# Patient Record
Sex: Female | Born: 1965 | Race: White | Hispanic: No | Marital: Married | Smoking: Former smoker
Health system: Southern US, Community
[De-identification: ages and names within clinical notes are randomized; demographics above are authoritative.]

## PROBLEM LIST (undated history)

## (undated) DIAGNOSIS — E039 Hypothyroidism, unspecified: Secondary | ICD-10-CM

## (undated) DIAGNOSIS — R531 Weakness: Principal | ICD-10-CM

## (undated) DIAGNOSIS — K56609 Unspecified intestinal obstruction, unspecified as to partial versus complete obstruction: Secondary | ICD-10-CM

## (undated) DIAGNOSIS — Z780 Asymptomatic menopausal state: Secondary | ICD-10-CM

## (undated) DIAGNOSIS — N809 Endometriosis, unspecified: Secondary | ICD-10-CM

## (undated) DIAGNOSIS — M479 Spondylosis, unspecified: Secondary | ICD-10-CM

## (undated) HISTORY — DX: Weakness: R53.1

## (undated) HISTORY — PX: BACK SURGERY: SHX140

## (undated) HISTORY — PX: OTHER SURGICAL HISTORY: SHX169

## (undated) HISTORY — PX: TUBAL LIGATION: SHX77

## (undated) HISTORY — PX: DILATION AND CURETTAGE OF UTERUS: SHX78

## (undated) HISTORY — DX: Asymptomatic menopausal state: Z78.0

## (undated) HISTORY — DX: Spondylosis, unspecified: M47.9

---

## 2000-11-01 ENCOUNTER — Observation Stay (HOSPITAL_COMMUNITY): Admission: RE | Admit: 2000-11-01 | Discharge: 2000-11-02 | Payer: Self-pay | Admitting: Orthopedic Surgery

## 2000-11-01 ENCOUNTER — Encounter: Payer: Self-pay | Admitting: Orthopedic Surgery

## 2001-04-21 ENCOUNTER — Ambulatory Visit (HOSPITAL_COMMUNITY): Admission: RE | Admit: 2001-04-21 | Discharge: 2001-04-21 | Payer: Self-pay | Admitting: Orthopedic Surgery

## 2001-05-19 ENCOUNTER — Other Ambulatory Visit: Admission: RE | Admit: 2001-05-19 | Discharge: 2001-05-19 | Payer: Self-pay | Admitting: Obstetrics and Gynecology

## 2002-02-15 ENCOUNTER — Encounter: Payer: Self-pay | Admitting: Obstetrics and Gynecology

## 2002-02-15 ENCOUNTER — Ambulatory Visit (HOSPITAL_COMMUNITY): Admission: RE | Admit: 2002-02-15 | Discharge: 2002-02-15 | Payer: Self-pay | Admitting: Obstetrics and Gynecology

## 2002-12-24 ENCOUNTER — Other Ambulatory Visit: Admission: RE | Admit: 2002-12-24 | Discharge: 2002-12-24 | Payer: Self-pay | Admitting: Obstetrics and Gynecology

## 2003-01-30 ENCOUNTER — Other Ambulatory Visit: Admission: RE | Admit: 2003-01-30 | Discharge: 2003-01-30 | Payer: Self-pay | Admitting: Obstetrics and Gynecology

## 2003-04-05 ENCOUNTER — Encounter (INDEPENDENT_AMBULATORY_CARE_PROVIDER_SITE_OTHER): Payer: Self-pay | Admitting: *Deleted

## 2003-04-05 ENCOUNTER — Ambulatory Visit (HOSPITAL_COMMUNITY): Admission: RE | Admit: 2003-04-05 | Discharge: 2003-04-05 | Payer: Self-pay | Admitting: Obstetrics and Gynecology

## 2003-05-30 ENCOUNTER — Encounter: Admission: RE | Admit: 2003-05-30 | Discharge: 2003-05-30 | Payer: Self-pay | Admitting: Specialist

## 2003-08-09 ENCOUNTER — Other Ambulatory Visit: Admission: RE | Admit: 2003-08-09 | Discharge: 2003-08-09 | Payer: Self-pay | Admitting: Obstetrics and Gynecology

## 2003-08-28 ENCOUNTER — Encounter: Admission: RE | Admit: 2003-08-28 | Discharge: 2003-08-28 | Payer: Self-pay | Admitting: Specialist

## 2003-08-30 ENCOUNTER — Observation Stay (HOSPITAL_COMMUNITY): Admission: RE | Admit: 2003-08-30 | Discharge: 2003-08-31 | Payer: Self-pay | Admitting: Specialist

## 2004-01-31 ENCOUNTER — Other Ambulatory Visit: Admission: RE | Admit: 2004-01-31 | Discharge: 2004-01-31 | Payer: Self-pay | Admitting: Obstetrics and Gynecology

## 2004-07-24 ENCOUNTER — Other Ambulatory Visit: Admission: RE | Admit: 2004-07-24 | Discharge: 2004-07-24 | Payer: Self-pay | Admitting: Obstetrics and Gynecology

## 2005-03-19 ENCOUNTER — Other Ambulatory Visit: Admission: RE | Admit: 2005-03-19 | Discharge: 2005-03-19 | Payer: Self-pay | Admitting: Obstetrics and Gynecology

## 2007-01-03 ENCOUNTER — Emergency Department (HOSPITAL_COMMUNITY): Admission: EM | Admit: 2007-01-03 | Discharge: 2007-01-03 | Payer: Self-pay | Admitting: Emergency Medicine

## 2007-09-28 ENCOUNTER — Ambulatory Visit (HOSPITAL_COMMUNITY): Admission: RE | Admit: 2007-09-28 | Discharge: 2007-09-28 | Payer: Self-pay | Admitting: Obstetrics and Gynecology

## 2009-09-02 ENCOUNTER — Encounter: Admission: RE | Admit: 2009-09-02 | Discharge: 2009-09-02 | Payer: Self-pay | Admitting: Allergy and Immunology

## 2010-05-21 ENCOUNTER — Emergency Department (HOSPITAL_COMMUNITY)
Admission: EM | Admit: 2010-05-21 | Discharge: 2010-05-21 | Payer: Self-pay | Source: Home / Self Care | Admitting: Emergency Medicine

## 2010-05-21 ENCOUNTER — Encounter
Admission: RE | Admit: 2010-05-21 | Discharge: 2010-05-21 | Payer: Self-pay | Source: Home / Self Care | Attending: Gastroenterology | Admitting: Gastroenterology

## 2010-05-28 ENCOUNTER — Encounter
Admission: RE | Admit: 2010-05-28 | Discharge: 2010-05-28 | Payer: Self-pay | Source: Home / Self Care | Attending: Gastroenterology | Admitting: Gastroenterology

## 2010-06-14 ENCOUNTER — Encounter: Payer: Self-pay | Admitting: Specialist

## 2010-08-03 LAB — DIFFERENTIAL
Basophils Absolute: 0 10*3/uL (ref 0.0–0.1)
Basophils Relative: 0 % (ref 0–1)
Eosinophils Absolute: 0 10*3/uL (ref 0.0–0.7)
Eosinophils Relative: 0 % (ref 0–5)
Lymphs Abs: 0.9 10*3/uL (ref 0.7–4.0)
Monocytes Relative: 8 % (ref 3–12)
Neutro Abs: 4.8 10*3/uL (ref 1.7–7.7)

## 2010-08-03 LAB — URINE MICROSCOPIC-ADD ON

## 2010-08-03 LAB — CBC
Hemoglobin: 13.2 g/dL (ref 12.0–15.0)
MCV: 92.2 fL (ref 78.0–100.0)
RBC: 4.36 MIL/uL (ref 3.87–5.11)
RDW: 12.5 % (ref 11.5–15.5)
WBC: 6.3 10*3/uL (ref 4.0–10.5)

## 2010-08-03 LAB — URINALYSIS, ROUTINE W REFLEX MICROSCOPIC
Bilirubin Urine: NEGATIVE
Glucose, UA: NEGATIVE mg/dL
Specific Gravity, Urine: 1.018 (ref 1.005–1.030)
pH: 6 (ref 5.0–8.0)

## 2010-08-03 LAB — POCT I-STAT, CHEM 8
Calcium, Ion: 1.12 mmol/L (ref 1.12–1.32)
Sodium: 139 mEq/L (ref 135–145)
TCO2: 26 mmol/L (ref 0–100)

## 2010-10-06 NOTE — Op Note (Signed)
[name], [name]                  ACCOUNT NO.:  [id number]   MEDICAL RECORD NO.:  [id number]          PATIENT TYPE:  AMB   LOCATION:  SDC                           FACILITY:  [facility]   PHYSICIAN:  Guy Sandifer. Henderson Cloud, M.D. DATE OF BIRTH:  Nov 26, 1965   DATE OF PROCEDURE:  09/28/2007  DATE OF DISCHARGE:                               OPERATIVE REPORT   PREOPERATIVE DIAGNOSIS:  Pelvic pain and desires permanent  sterilization.   POSTOPERATIVE DIAGNOSIS:  Pelvic pain and desires permanent  sterilization.   PROCEDURE:  Laparoscopy with bilateral tubal ligation with Filshie clips  and ablation of endometriosis.   SURGEON:  Guy Sandifer. Henderson Cloud, M.D.   ANESTHESIA:  General endotracheal intubation.   ESTIMATED BLOOD LOSS:  Drops.   INDICATIONS AND CONSENT:  This patient is a 45 year old married white  female G1, P1 with known endometriosis and steadily increasing  premenstrual pelvic pain.  She desires permanent sterilization.  After  discussion of options she is admitted for laparoscopy with tubal  ligation.  Alternate methods of contraception, permanence of the  procedure, failure rate, increased ectopic risk of tubal ligation are  reviewed.  Potential risks and complications are also discussed  preoperatively including but limited to infection, organ damage,  bleeding requiring transfusion, blood products with HIV, and hepatitis  acquisition, DVT, PE, and pneumonia.  All questions are answered and  consent is signed on the chart.   FINDINGS:  Upper abdomen is grossly normal.  Appendix is normal.  Uterus  is normal in size and contour.  Anterior and posterior cul-de-sacs are  normal.  There is a 1 cm black spot of endometriosis on the left pelvic  sidewall well above the course of the ureter.  There is a 5-mm white  puckered area of endometriosis on the right pelvic sidewall well above  the course of the ureter.  Tubes and ovaries are normal bilaterally.   PROCEDURE:  The patient is taken  to operating room where she is  identified, placed in dorsal supine position, and general anesthesia is  induced.  She is then placed in dorsal lithotomy position where she is  prepped abdominally and vaginally, bladder straight catheterized.  Hulka  tenaculum is placed in uterus as a manipulator.  She is draped in a  sterile fashion.  The infraumbilical and suprapubic areas are injected  in the midline with 0.5% plain Marcaine.  Small infraumbilical incision  is made and a disposable Veress needle is placed without difficulty.  A  normal syringe and drop test are noted.  2 L of gas are insufflated  under low pressure with good tympany in the right upper quadrant.  Veress needle is removed and a 10/11 XL bladeless disposable trocar  sleeve is placed using direct visualization with the diagnostic  laparoscope.  After placement, the operative laparoscope is placed.  Small suprapubic incision is made and a 5-mm XL bladeless disposable  trocar sleeve is placed under direct visualization without difficulty.  The above findings are noted.  The endometriosis on the pelvic sidewalls  are ablated with bipolar cautery bilaterally.  This is well above the  course of the ureters.  Then the right fallopian tube is grasped and  elevated and a Filshie clip is placed on the proximal 01/30 of the tube.  Similar procedure is carried out on the left side.  After removing the  Filshie clip applicator, inspection reveals entire width of the tube to  be within the clip.  The heel of the clip can be seen to the mesosalpinx  bilaterally.  Good hemostasis is noted.  10 mL of 0.5% plain Marcaine is  dripped on the fallopian tubes bilaterally as well.  The suprapubic  trocar sleeve is removed.  The  pneumoperitoneum is reduced and the umbilical trocar sleeve is removed.  The umbilical incision is closed with a subcuticular 2-0 Vicryl suture.  Both incisions are closed with Dermabond.  Hulka tenaculum is removed   and no bleeding is noted.  All counts are correct and the patient is  wakened and taken to recovery room in stable condition.      Guy Sandifer Henderson Cloud, M.D.  Electronically Signed     JET/MEDQ  D:  09/28/2007  T:  09/28/2007  Job:  [id number]

## 2010-10-06 NOTE — H&P (Signed)
NAMEMARLEAH, [name]                  ACCOUNT NO.:  [id number]   MEDICAL RECORD NO.:  [id number]          PATIENT TYPE:  AMB   LOCATION:  SDC                           FACILITY:  [facility]   PHYSICIAN:  Guy Sandifer. Henderson Cloud, M.D. DATE OF BIRTH:  09/01/1965   DATE OF ADMISSION:  DATE OF DISCHARGE:                              HISTORY & PHYSICAL   CHIEF COMPLAINT:  Pelvic pain and desires permanent sterilization.   HISTORY OF PRESENT ILLNESS:  The patient is a 45 year old married white  female G1, P1 who has steadily increasing premenstrual pain starting  several days before each menses.  It is becoming progressively worse.  Endometriosis was documented on laparoscopy in 2004.  After discussion  of options, she is being admitted for laparoscopy with tubal ligation.  The patient requests permanent sterilization.  Alternate methods of  contraception, failure rate, risks and benefits of surgery have been  reviewed preoperatively.   PAST MEDICAL HISTORY:  1. Endometriosis.  2. History of cervical dysplasia.   PAST SURGICAL HISTORY:  1. Laparoscopy as above.  2. Wrist surgery.  3. LEEP procedure 1996.   MEDICATIONS:  Mobic daily.   No known drug allergies.   SOCIAL HISTORY:  Denies tobacco abuse.  Drinks alcohol on social basis.  Denies drug abuse.   FAMILY HISTORY:  Noncontributory.   REVIEW OF SYSTEMS:  NEUROLOGICAL:  Denies headache.  CARDIAC:  Denies  chest pain.  PULMONARY:  Denies shortness of breath.   PHYSICAL EXAMINATION:  Height 5 feet 0 inches, weight 128 pounds, blood  pressure 100/64.  HEENT:  Without thyromegaly.  LUNGS:  Clear to auscultation.  HEART:  Regular rate and rhythm.  BACK:  Without CVA tenderness.  BREASTS:  Without mass, retraction, or discharge.  ABDOMEN:  Soft, nontender without masses.  PELVIC EXAM:  Vulva, vagina, cervix without lesions.  Uterus is normal  size, mobile, nontender.  Adnexa nontender without masses.  EXTREMITIES:  Grossly within normal  limits.  NEUROLOGICAL EXAM:  Grossly within normal limits.   ASSESSMENT:  Pelvic pain and desires permanent sterilization.   PLAN:  Laparoscopy and tubal ligation with Filshie clips.      Guy Sandifer Henderson Cloud, M.D.  Electronically Signed     JET/MEDQ  D:  09/27/2007  T:  09/27/2007  Job:  [id number]

## 2010-10-09 NOTE — Op Note (Signed)
NAME:  JAYCEY, GENS                            ACCOUNT NO.:  [id number]   MEDICAL RECORD NO.:  [id number]                   PATIENT TYPE:  AMB   LOCATION:  SDC                                  FACILITY:  [facility]   PHYSICIAN:  Guy Sandifer. Arleta Creek, M.D.           DATE OF BIRTH:  1965-09-28   DATE OF PROCEDURE:  04/05/2003  DATE OF DISCHARGE:                                 OPERATIVE REPORT   PREOPERATIVE DIAGNOSIS:  Pelvic pain.   POSTOPERATIVE DIAGNOSIS:  Endometriosis.   PROCEDURE:  Laparoscopy with resection and ablation of endometriosis and  chromopertubation of fallopian tubes.   SURGEON:  Guy Sandifer. Henderson Cloud, M.D.   ANESTHESIA:  General with endotracheal intubation.   ESTIMATED BLOOD LOSS:  Drops.   SPECIMENS:  Biopsy of right and left pelvis.   INDICATIONS AND CONSENT:  This patient is a 45 year old married white  female, G1, P1, with increasing pelvic pain.  Laparoscopy has been discussed  with the patient preoperatively.  Potential risks and complications have  been discussed preoperatively, including but not limited to infection,  bowel, bladder, and ureteral damage, bleeding requiring transfusion of blood  products with possible transfusion reaction, HIV and hepatitis acquisition,  DVT, PE, pneumonia, and recurrent pain.  All questions have been answered  and consent is signed on the chart.   FINDINGS:  Her upper abdomen is grossly normal.  The appendix is normal.  The uterus is normal in size and contour.  The fallopian tubes are normal  bilaterally with dye noted from each tube upon chromopertubation.  Ovaries  are normal.  Anterior cul-de-sac is normal.  The posterior cul-de-sac  contains a single black implant of endometriosis in the mid-cul-de-sac.  The  right pelvis contains a light gray implant of endometriosis about 4 mm in  diameter lateral to the ureter.  The left pelvis contains a dark-black 8 mm  heavy implant of endometriosis lateral to the ureter.   DESCRIPTION OF PROCEDURE:  The patient is taken to the operating room and  placed in the dorsal supine position, where general anesthesia is induced  via endotracheal intubation.  She is then placed in the dorsal lithotomy  position, where she is prepped abdominally and vaginally, the bladder is  straight-catheterized, an acorn tenaculum is placed in the cervix and held  with a single-tooth tenaculum, and she is draped in a sterile fashion.  A  small infraumbilical incision is made and a Veress needle is placed and the  syringe and drop test are normal.  Two liters of gas are insufflated under  low pressure with good percussion in the right upper quadrant.  The Veress  needle is removed and is replaced with a 10-11 disposable trocar sleeve.  Placement is verified with the laparoscope and no damage to surrounding  structures is noted.  A small suprapubic incision is made and a 5 mm  nondisposable trocar sleeve is  placed under direct visualization without  difficulty.  The above findings are noted.  The implants of endometriosis  are ablated in the posterior cul-de-sac as well as an additional implant on  the distal end of the right uterosacral ligament.  The implants on the right  and left pelvis are resected sharply.  This is done well clear of the  ureter.  Hemostasis is obtained with bipolar cautery at the peritoneal  edges.  The ureters are seen to peristalse normally before and after the  procedure.  Good hemostasis is verified.  Excess fluid is removed.  Interceed is back-loaded through the laparoscope and placed over both of the  areas of biopsy.  The suprapubic trocar sleeve is removed and the peritoneum  is reduced and good hemostasis is noted all around.  The umbilical trocar  sleeve is removed and the umbilical incision is closed with a 0 Vicryl in  the subcutaneous layers.  Both incisions are then injected with 0.5% plain  Marcaine and skin is closed with Dermabond on both  incisions.  Instruments  are removed from the vagina.  Good hemostasis is noted.  All counts are  correct.  The patient is awakened and taken to the recovery room in stable  condition.                                               Guy Sandifer Arleta Creek, M.D.    JET/MEDQ  D:  04/05/2003  T:  04/05/2003  Job:  [id number]

## 2010-10-09 NOTE — Op Note (Signed)
[facility]  Patient:    [name], [name] Visit Number: [id number] MRN: [id number]          Service Type: Attending:  Elisha Ponder, M.D. Dictated by:   Elisha Ponder, M.D.                             Operative Report  DATE OF BIRTH:  1965-05-26.  PREOPERATIVE DIAGNOSIS:  Status post right wrist reconstruction with corrective osteotomy, triangular fibrocartilage repair, etc.  Patient presents for hardware removal, manipulation under anesthesia, and tenolysis of the extensor tendons as necessary.  POSTOPERATIVE DIAGNOSIS:  Status post right wrist reconstruction with corrective osteotomy, triangular fibrocartilage repair, etc.  Patient presents for hardware removal, manipulation under anesthesia, and tenolysis of the extensor tendons as necessary.  PROCEDURES: 1. Manipulation under anesthesia, right wrist. 2. Extensor pollicis longus tenolysis, right wrist. 3. Removal of retained hardware from the right wrist.  SURGEON: Elisha Ponder, M.D.  COMPLICATIONS:  None.  ANESTHESIA:  General.  DRAINS:  One.  TOURNIQUET TIME:  Less than an hour.  INDICATION FOR PROCEDURE:  This patient is a very pleasant 45 year old female who presents with the above-mentioned diagnosis.  I have counseled her in regard to risks and benefits of surgery, including risk of infection, bleeding, anesthesia, damage to normal structures, and failure of surgery to accomplish the intended goals of relieving symptoms and restoring function. This patient has had a very significant fracture of her wrist with TFC injury. She underwent corrective osteotomy with open TFC repair.  Following this, she underwent supervised therapy and now presents for hardware removal, manipulation under anesthesia, and tenolysis as necessary.  She understands all risks and benefits.  DESCRIPTION OF PROCEDURE:  The patient was seen by myself and anesthesia. Following this, she was taken to the  operative suite and underwent prophylactic antibiotic administration, was laid supine and appropriately padded and underwent smooth induction of general anesthesia under the direction of Bedelia Person, M.D.  Once this was done, the patient then had the arm isolated, prepped and draped in the usual sterile fashion about the right upper extremity.  Following this, the patient underwent a gentle manipulation under anesthesia after sterile draping was applied.  The manipulation under anesthesia was in the flexion, extension, and pronation-supination planes.  My main objective was to increase her supination as well as flexion and extension.  Supination was achieved to full passively, and there was no significant rebound effect from this.  I was able to manipulate her to a point where she was stiffened and then pressed her past this point somewhat.  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  She tolerated this without difficulty.  There was no iatrogenic fracture noted.  The patient tolerated the manipulation under anesthesia well.  Following this the patient had an incision made under 250 mm of tourniquet control about the dorsal aspect of the wrist.  An ellipse of skin was taken from her previous scar.  Following this, the ellipse of skin was removed and dissection was carried down.  The EPL tendon was found and noted to be somewhat encased in some scar tissue.  Thus, she underwent a tenolysis throughout its length.  I performed a tenolysis as necessary about the third compartment (EPL).  This was done without difficulty under 4.0 loupe magnification, taking care to avoid superficial radial nerve and its branches. Following this, the patient then underwent isolation of the plate.  This was  isolated and then screws were removed, as was the condylar blade plate.  This blade plate was removed to my satisfaction without difficulty.  Following this, the wrist was taken through a range of motion and noted to be  excellent. The osteotomy site was well-healed, and there were no complications.  At this juncture I then removed the tourniquet which was inflated and applied a copious amount of irrigation to the wound.  Hemostasis was secured with bipolar electrocautery, and the wound was then closed with 3-0 Vicryl followed by subcuticular Prolene stitch.  Steri-Strips were then placed.  A TLS drain #7 in size was placed prior to definitive wound closure.  This was allowed to exit proximally.  Marcaine 0.25% without epinephrine was placed in the wound for postop analgesia.  The patient tolerated this well, a short-arm splint was applied, and the patient was awake, alert, and oriented in the recovery room, and I did show her the improved range of motion.  She tolerated this well without difficulty, and there were no complications.  All sponge, needle, and instrument counts were reported as correct.  I have asked the patient to notify me should any problems occur.  She will remove her drain tomorrow.  She was discharged home on Percocet and Robaxin.  All questions have been encouraged and answered. Dictated by:   Elisha Ponder, M.D. Attending:  Elisha Ponder, M.D. DD:  04/21/01 TD:  04/21/01 Job: [id number]

## 2010-10-09 NOTE — Op Note (Signed)
[facility]  Patient:    [name], [name]                         MRN: [id number] Proc. Date: 11/03/00 Adm. Date:  [id number] Disc. 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  OPERATION: 1. Corrective osteotomy, right radius, with iliac crest bone graft obtained    from the right hip and plating with a Synthes fixed blade plate. 2. Posterior interosseous nerve neurectomy, right wrist. 3. Open right triangular fibular cartilage ligament tear repair which was a    chronic tear. 4. Pinning, right distal radial ulnar joint.  SURGEON:  Elisha Ponder, M.D.  ASSISTANTJill Side P. Mahar, P.A.  ANESTHESIA:  General endotracheal anesthesia.  TOURNIQUET TIME:  30 minutes followed by deflation of greater than 20 minutes followed by reinflation to approximately 100 minutes.  DRAINS:  Two drains were placed in forearm and wrist, and one drain was placed in the hip.  COMPLICATIONS:  None.  INDICATIONS FOR PROCEDURE:  This patient is a 45 year old white female who sustained a radius fracture previously.  This went on to some progressive angulatory collapse and subsequent distal radial ulnar joint dislocation secondary to a destabilizing TFC tear with ulnar styloid fracture.  The patient had a type II TFC tear.  This went on to instability, distal ulnar joint dislocation, and radial shortening despite aggressive surgical efforts. The patient now has chronic pain, loss of motion and  grip strength.  I have counselled her in regards to risks and benefits of surgery and have counselled her in regards to surgical options and conservative options.  Alternatives, etc., were discussed at length; and, with this in mind, she has decided to proceed with the above-mentioned operation.  I discussed with her that this is not 100% guaranteed for restoration of function, but we will try to improve the anatomy to its normal state.  I have obtained preoperative MRI, radiographs, and other objective information from her exam to aid [place] in her workup.  All questions have been encouraged and answered.  She desires to proceed.  OPERATIVE FINDINGS:  The patient had a malunion of her radius.  This was corrected.  Volar tilt was restored and high three gained.  The patient had a large destabilizing TFC tear which was repaired.  The distal radial ulnar joint was pinned in slight supination to allow for TFC healing.  The patient had recreation of the anatomy performed without difficulty as described within the context of this op note.  PROCEDURE IN DETAIL:  The patient was seen by myself in anesthesia.  She was given preoperative antibiotics, taken to the operative suite, and underwent smooth induction of general endotracheal anesthesia.  Following this, she was placed supine, appropriately padded, and prepped and draped in the usual sterile fashion of both her right upper extremity and right hip region.  After sterile field was secured, the patient  had the arm elevated and tourniquet insufflated to 250 mmHg.  Under tourniquet control, the patient had incision made dorsal radially.  Dissection was carried through skin with knife blade. Following this, the patient had the EPL identified and transposed.  The EDC was swept ulnarly.  The posterior interosseous nerve identified, and this underwent neurectomy with a crushing and cauterization technique.  Once this was done, the distal radius was  exposed subperiosteally.  Metacarpal retractors were placed, and the the patient then underwent corrective osteotomy by placing 0.062 K wires in the distal radius in line with the articular surface.  Following this, osteotomy cut was made proximal to the distal radial ulnar joint as noted on the radiographic image.  Care was taken to not cut the volar cortex.  The patient then had the two K wires previously placed directed distally in terms of distractionary technique.  This allowed for an opening wedge corrective osteotomy.  I then gauged the amount of bone graft that would be needed and deflated the tourniquet.  X-rays were taken at this time which did show correction of the volar tilt and restoration of radial height.  With this noted, wet antibiotic-soaked sponges were placed over the wound, and the patient then had attention turned towards the hip where iliac crest bone graft was obtained. This was obtained without difficulty by incising the skin, dissecting down to the fascia sharply, and then elevating the periosteal tissue off of the iliac crest and taking a bone graft with cancellous as well as tricortical bone. This was done to my satisfaction without difficulty.  The patient did not have any destabilization of her iliac crest occur during this period.  Once this bone graft was taken, additional cancellous bone was removed, then a copious irrigation, placement of Gelfoam, hemostasis ensured, and placement of an 8-inch Hemovac drain with closure of the wound in layered fashion with 0 Vicryl followed by Vicryl in the subcutaneous tissue and closure of the skin edge with Prolene.  Once this was done, the drain was hooked up to suction.  Attention was then turned back towards the radius where the bone graft was inserted.  Distractionary technique used and correction obtained with regards to radial inclination, volar tilt, and radial height.  Bone graft was packed nicely, and  fixation was then achieved with 2.7 fixed blade plate.  Screws were placed proximally and distally to secure the graft.  Preliminary fixation  was then removed, and wrist was taken through range of motion and noted to be stable with the corrections obtained.  Following this, the patient had the extensor retinaculum closed over the plate.  This was done with a Mersilene and Vicryl combination suture, and periosteal tissues were closed over the plate so that the EPL was left in the subcutaneous region, transposed, without any contacts with the plate.  The ECRB and ECRL tendons were stable and without plate contact, and the EDC was without contact against the plate as well.   I was very happy with the closure and corrections obtained.  Once corrective osteotomy was performed, an incision was made about the ulnar border of the hand.  Dissection was carried down to the interval between the ECU and EDM.  This was opened and, following this, the TFC was identified with an inverted T capsular cut.  The patients destabilized TFC tear with associated ulnar styloid fracture was identified and mobilized.  Once this was done, the patient then had the distal ulna prepared.  This was done  with rongeur to greatest stable bleeding base of bone.  The patient had a destabilizing tear of the TFC with a ligamentum subcruatum attached to the TFC.  This area was prepared with a curet for a nice bleeding base.  Following this, drill holes were placed for passage of sutures.  Once this was done and the area was prepared, the TFC had multiple strands of Merselene suture placed in a dorsal to palmar fashion.  Multiple Mersilene throws were placed to harness the TFC at its attachement to the distal ulnar fovia.  Once this was done, suture pass was placed through previously made drill holes with 0.045 Kirschner wire about the distal ulna.  These sutures were passed above and below the ECU in its position.  They exited  proximally about the ulnar shaft. This allowed the TFC to be replaced to its anatomic position which was the fovea of the ulna.  The tension was pulled and noted to be excellent.  I was pleased with this and with the overall integrity of the repair at this juncture.  I took care not to destabilize the ECU tendon itself.  Once this was done, the patient had pinning of the distal radial ulnar joint.  This was done in slight supination to maintain a corrected distal radial ulnar joint. The distal radial ulnar joint had previously been noted to be easily dislocatable and fat in a native dislocated state prior to the corrections. Thus, there was a large destabilizing tear with distal radial ulnar joint dislocation.  This was rather unstable.  I had concerns in regards to this preoperatively and intraoperatively.  However, intraoperative findings did document the obvious pathology, and hopefully this will allow restoration of the competency of the joint with TFC repair.  Once the joint was pinned in a reduced state, the TFC repair was then performed by tightening down the sutures close to the TFC and passed through the previously placed drill holes in the ulna.  The TFC sat nicely in its ulna fovia.  The sutures were tightened down appropriately without difficulty.  Following this, the ECU had stabilization performed by placing the extensor retinaculum below and then above it as a sling.  This extensor retinaculum was previously dissected on the approach.  It was taken off of the retinacular attachments around the FCU and based against the EDM region dorsally.  The patient had this secured without difficulty with Mersilene and Vicryl sutures.  The EDM glided nicely without problems, and there was no capturing of the tendons within the sutures.  Once this was accomplished, the patient had tourniquet deflated and hemostasis obtained with bipolar electrocautery.  Hemostasis was also  obtained during the initial dissection with cautery.  The patient had hemostasis achieved.  Two small TLS drains were placed.  Following this, the patient had pin caps placed over the 0.062 K wire, mobilizing the forearm, and the patient then had layered closure with Prolene placed in the skin edge.  Drains were hooked up to suction.  Sterile compressive dressings were applied.  I checked the compartments at the completion of the case.  They were noted to soft, nontender, and without abnormality.  The patient had good refill and hemostasis.  With this noted, the patient then had a long arm splint applied without difficulty by myself.  Dressing about the right hip was applied in sterile compressive variety.  She was awakened from anesthesia and transferred to the recovery room in stable condition.  I should note that I  did place Marcaine in the wounds for postoperative analgesia.  This was 0.25% without epinephrine in the arm and Marcaine/Sensorcaine with epinephrine in the hip. The patient was stable, awake, alert, and oriented in the recovery room, had good finger range of motion and sensation overall.  There were no complications.  She will be monitored at [facility].  I discussed all issues with her and her family.  At the present time, she will require a thorough rehabilitative effort.  We will need to ensure that she has no immediate postop complications, and then we will need to progress her along in therapy to protect both the corrective osteotomy and the distal radial ulnar joint stabilization procedure.  I have discussed all issues with her at length.  It was a pleasure to participate in her care, and I look forward to participating in her postoperative recovery. DD:  11/03/00 TD:  11/03/00 Job: [id number]

## 2010-10-09 NOTE — H&P (Signed)
   NAME:  [name], [name]                            ACCOUNT NO.:  [id number]   MEDICAL RECORD NO.:  [id number]                   PATIENT TYPE:  AMB   LOCATION:  SDC                                  FACILITY:  [facility]   PHYSICIAN:  Guy Sandifer. Arleta Creek, M.D.           DATE OF BIRTH:  1965/09/17   DATE OF ADMISSION:  04/05/2003  DATE OF DISCHARGE:                                HISTORY & PHYSICAL   CHIEF COMPLAINT:  Pelvic pain.   HISTORY OF PRESENT ILLNESS:  This patient is a 45 year old, married, white  female, G1, P1, who complains of increasing pelvic pain.  Laparoscopy has  been discussed with the patient.  Potential risks have been reviewed  preoperatively.   PAST MEDICAL HISTORY:  Cervical dysplasia.   PAST SURGICAL HISTORY:  1. Wrist surgery.  2. LEEP procedure in 1996.   MEDICATIONS:  Vitamins.   ALLERGIES:  No known drug allergies.   SOCIAL HISTORY:  The patient denies tobacco, alcohol, or drug abuse.   FAMILY HISTORY:  Noncontributory.   REVIEW OF SYSTEMS:  NEUROLOGIC:  Denies headache.  CARDIAC:  Denies chest  pain.  PULMONARY:  Denies shortness of breath.   PHYSICAL EXAMINATION:  VITAL SIGNS:  Height 5 feet 0 inches, weight 116.5  pounds, blood pressure 80/60.  HEENT:  Without thyromegaly.  LUNGS:  Clear to auscultation.  HEART:  Regular rate and rhythm.  BACK:  Without CVA tenderness.  BREASTS:  Without masses, retraction, or discharge.  ABDOMEN:  Soft, nontender without masses.  PELVIC:  Both the vagina and cervix without lesion.  Uterus normal size,  mobile, nontender.  Adnexa nontender without masses.  EXTREMITIES/NEUROLOGIC:  Grossly within normal limits.    ASSESSMENT:  Pelvic pain.   PLAN:  Laparoscopy.                                               Guy Sandifer Arleta Creek, M.D.    JET/MEDQ  D:  04/04/2003  T:  04/04/2003  Job:  [id number]

## 2010-10-09 NOTE — Op Note (Signed)
NAME:  [name], [name]                            ACCOUNT NO.:  [id number]   MEDICAL RECORD NO.:  [id number]                   PATIENT TYPE:  AMB   LOCATION:  DAY                                  FACILITY:  [facility]   PHYSICIAN:  Jene Every, M.D.                 DATE OF BIRTH:  Nov 22, 1965   DATE OF PROCEDURE:  DATE OF DISCHARGE:                                 OPERATIVE REPORT   PREOPERATIVE DIAGNOSES:  Degenerative disk disease L5-S1, spinal stenosis.   POSTOPERATIVE DIAGNOSES:  1. Degenerative disk disease L5-S1, spinal stenosis.  2. Vascular leash around the S1 nerve root.   PROCEDURE:  Lateral recess decompress with foraminotomy of S1,  hemilaminotomy, microdiskectomy. Neurolysis of the S1 nerve root.   ANESTHESIA:  General.   ASSISTANT:  Roma Schanz, P.A.   BRIEF HISTORY:  A 45 year old with S1 refractory radicular pain secondary to  HNP and lateral recess stenosis at 5-1 confirmed with MRI, continued  positive neural tension signs and selected conservative treatment.  Patient  with persistent pain, disabling.  Operative intervention was indicated for  decompression of the S2 nerve root. The risks and benefits were discussed  including bleeding, infection, injury to neurovascular structures, no change  in symptoms, worsening of symptoms, need for repeat diskectomy or fusion in  the future, etc.   TECHNIQUE:  The patient in supine position and after induction of adequate  general anesthesia and 1 g of Kefzol, she was placed prone on Andrews frame,  all bony prominences well padded. The lumbar region was prepped and draped  in the usual sterile fashion. An 18 gauge spinal needle was utilized to  localize the 5-1 interspace, confirmed with x-ray.  An incision was made  from the spinous process of 5 to S1, subcutaneous tissue was dissected,  electrocautery was utilized to achieve hemostasis. Dorsolumbar fascia  identified and divided in line with skin incision. The  paraspinous muscle  elevated from the lamina of 5 and S1, McCullough retractors placed,  operating microscope was draped and brought onto the surgical field.  The  ligamentum flavum was detached from the cephalad edge of S1 with a small  microcurette and the caudad edge of 5-1 utilizing the 10 mm Kerrison to  perform a hemilaminotomy.  Neural patties placed beneath the ligamentum  flavum, ligamentum flavum removed from the interspace.  I performed a  hemilaminotomy of the caudad edge of S1 and performed a foraminotomy of S1  due to some lateral recess stenosis.  At identifying the S1 nerve root,  gently mobilized medially a focal HNP as well as an epidural venous plexus  and leash was noted.  This was neurolysed to mobilize the S1 nerve root.  Electrocautery was utilized to achieve hemostasis. I made an annulotomy in  the 5-S1 disk space after confirming it in the intralaminar space with an x-  ray and I removed a copious  portion of disk material from the disk space and  the subannular space and mobilized it with a nerve hook and upbiting  straight pituitary. After a thorough diskectomy, the S1 nerve root was found  to be unimpinged, excursion 1 cm medial to the pedicle without difficulty.  Hockey stick probe placed at the foramen of S1 and 5 and found to be widely  patent. There was no disk material noted within the axilla of the root  underneath the thecal sac or cephalad.  I then copiously irrigated the disk  space with antibiotic irrigation.  Inspection revealed no CSF leakage or  active bleeding.  The McCullough retractor was removed, paraspinous muscles  inspected with no evidence of active bleeding.  The dorsolumbar fascia was  reapproximated with #0 Vicryl interrupted figure-of-eight sutures. The  subcutaneous tissue reapproximated with 2-0 Vicryl simple sutures, skin was  reapproximated with 4-0 subcuticular Prolene. The wound was reinforced with  Steri-Strips, sterile dressing  applied. She was placed supine on the  hospital bed, extubated without difficulty and transported to the recovery  room in satisfactory condition.   The patient tolerated the procedure well with no complications.                                               Jene Every, M.D.    [name]  D:  08/30/2003  T:  08/30/2003  Job:  [id number]

## 2010-10-09 NOTE — Consult Note (Signed)
NAME:  [name], [name]                            ACCOUNT NO.:  [id number]   MEDICAL RECORD NO.:  [id number]                   PATIENT TYPE:  AMB   LOCATION:  SDC                                  FACILITY:  [facility]   PHYSICIAN:  Olga Millers, M.D.                DATE OF BIRTH:  July 12, 1965   DATE OF CONSULTATION:  04/05/2003  DATE OF DISCHARGE:                                   CONSULTATION   CARDIOLOGY CONSULTATION:   REASON FOR CONSULTATION:  [name] is a pleasant 45 year old female with  past medical history of mitral valve prolapse, gastroesophageal reflux  disease and now status post laparoscopy for evaluation of PVCs.  The patient  states that she was diagnosed with mitral valve prolapse many years ago.  She did have an echocardiogram approximately 3 years ago but she does not  recall where.  There are no records available.  She typically is very active  and denies any dyspnea on exertion, orthopnea, PND, pedal edema, chest pain,  palpitations, presyncope, or history syncope.  The patient underwent  laparoscopic surgery today.  Postop in the recovery room she was noted to  have occasional PVCs and a rare couplet on telemetry.  Because of these we  were asked to further evaluate.  Of note she is completely asymptomatic with  these including no presyncope, no chest pain, no palpitations, and no  shortness of breath.   MEDICATIONS AT HOME INCLUDE:  Prenatal vitamin and Allegra.   SOCIAL HISTORY:  She does not smoke.  She occasionally consumes alcohol.   FAMILY HISTORY:  Negative for coronary artery disease or sudden death.   PAST MEDICAL HISTORY:  There is no diabetes mellitus, hypertension, or  hyperlipidemia.  She does have a history of gastroesophageal reflux disease.  She has a history of mitral valve prolapse by report.  She has had prior  surgery on her hand secondary to a prior fracture.  She has one son.  There  is no other significant past medical history noted.   REVIEW OF SYSTEMS:  She denies any headaches or fevers or chills.  There is  no productive cough or hemoptysis.  There is no dysphagia, odynophagia,  melena, or hematochezia.  There is no dysuria or hematuria.  There is no  rash or seizure activity.  There is no orthopnea, PND, or pedal edema.  There is no history of syncope.  There is no claudication noted.  There is  no recent weight loss.  The remaining systems are negative.   PHYSICAL EXAMINATION TODAY:  VITAL SIGNS:  Her physical exam today shows a  blood pressure of 90/60 and her pulse is 75.  GENERAL:  She is well developed and well nourished in no acute distress.  Her sin is warm and dry.  She does not appear to be depressed and there is  no __________.  HEAD AND NECK:  Her HEENT is unremarkable with no abnormalities.  Her neck  is supple with normal upstroke bilaterally and there are no bruits noted.  There is no jugular venous distention and no thyromegaly noted.  RESPIRATORY:  Her chest clear to auscultation with normal expansion.  CARDIOVASCULAR:  Regular rate and rhythm.  S1 and S2 are normal.  There is a  2/6 systolic ejection murmur at the left sternal border.  I cannot  appreciate a mitral valve click and there is no S3 or S4.  ABDOMEN:  She is status post laparoscopy.  I cannot appreciate  hepatosplenomegaly.  Her bowel sounds are diminished.  There is no pulsatile  masses palpated.  She has 2+ femoral pulses bilaterally and no bruits.  EXTREMITIES:  Her extremities show no edema and I can palpate no cords.  She  has 2+ dorsalis pedis pulses bilaterally.  NEUROLOGICAL:  Grossly intact.   ELECTROCARDIOGRAM:  Her electrocardiogram shows a normal sinus rhythm with  occasional PVCs.  There is a first degree AV block.  There is right axis  deviation.  There are no ST changes and her QT is not prolonged.   LABORATORIES:  Laboratories drawn today include a sodium of 136 with  potassium of 4.4.  Her BUN and creatinine are 12  and 0.7.  Her magnesium is  1.7.  The patient did have a CBC on April 01, 2003 which showed a white  blood cell count of 4.9 with a hemoglobin of 12.7 and hematocrit of 36.6.  Her platelet count was 258.  Her serum hCG at that time was normal.   DIAGNOSES:  1. Asymptomatic premature ventricular contractions and rare couplet noted.  2. History of mitral valve prolapse.  3. Status post laparoscopy.  4. History of gastroesophageal reflux disease.   PLAN:  [name] is for evaluation of asymptomatic PVCs and a rare couplet.  She is completely asymptomatic with no chest pain, shortness of breath or  palpations, and there is no history of syncope.  Her electrolytes are normal  and there is no history of CHF symptoms or chest pain.  Her QT is not  prolonged.  Her PVCs are most likely secondary to the mild hyperadrenergic  state associated with her recent surgery.  Given that she is asymptomatic  and there is no history of syncope or heart failure symptoms we will plan to  discharge today from a cardiac standpoint.  We will plan an outpatient  echocardiogram to reassess her LV function and to reassess her mitral valve  prolapse.  She will follow up with me after the echocardiogram.  I do not  think therapy is required at this point as her PVCs are asymptomatic.                                               Olga Millers, M.D.    BC/MEDQ  D:  04/05/2003  T:  04/05/2003  Job:  [id number]

## 2010-10-12 ENCOUNTER — Other Ambulatory Visit: Payer: Self-pay | Admitting: Gastroenterology

## 2010-12-07 ENCOUNTER — Ambulatory Visit
Admission: RE | Admit: 2010-12-07 | Discharge: 2010-12-07 | Disposition: A | Discharge status: Home / Self Care | Payer: Federal, State, Local not specified - PPO | Source: Ambulatory Visit | Attending: Gastroenterology | Admitting: Gastroenterology

## 2010-12-07 ENCOUNTER — Other Ambulatory Visit: Payer: Self-pay | Admitting: Gastroenterology

## 2010-12-07 DIAGNOSIS — R52 Pain, unspecified: Secondary | ICD-10-CM

## 2011-01-16 ENCOUNTER — Inpatient Hospital Stay (HOSPITAL_COMMUNITY)
Admission: EM | Admit: 2011-01-16 | Discharge: 2011-01-19 | DRG: 181 | Disposition: A | Discharge status: Home / Self Care | Payer: Federal, State, Local not specified - PPO | Attending: Internal Medicine | Admitting: Internal Medicine

## 2011-01-16 ENCOUNTER — Emergency Department (HOSPITAL_COMMUNITY): Payer: Federal, State, Local not specified - PPO

## 2011-01-16 DIAGNOSIS — I059 Rheumatic mitral valve disease, unspecified: Secondary | ICD-10-CM | POA: Diagnosis present

## 2011-01-16 DIAGNOSIS — K219 Gastro-esophageal reflux disease without esophagitis: Secondary | ICD-10-CM | POA: Diagnosis present

## 2011-01-16 DIAGNOSIS — Z8742 Personal history of other diseases of the female genital tract: Secondary | ICD-10-CM

## 2011-01-16 DIAGNOSIS — K633 Ulcer of intestine: Secondary | ICD-10-CM | POA: Diagnosis present

## 2011-01-16 DIAGNOSIS — E039 Hypothyroidism, unspecified: Secondary | ICD-10-CM | POA: Diagnosis present

## 2011-01-16 DIAGNOSIS — K56609 Unspecified intestinal obstruction, unspecified as to partial versus complete obstruction: Principal | ICD-10-CM | POA: Diagnosis present

## 2011-01-16 DIAGNOSIS — IMO0002 Reserved for concepts with insufficient information to code with codable children: Secondary | ICD-10-CM | POA: Diagnosis present

## 2011-01-16 LAB — COMPREHENSIVE METABOLIC PANEL
ALT: 19 U/L (ref 0–35)
Albumin: 4.5 g/dL (ref 3.5–5.2)
BUN: 13 mg/dL (ref 6–23)
CO2: 24 mEq/L (ref 19–32)
Chloride: 95 mEq/L — ABNORMAL LOW (ref 96–112)
GFR calc Af Amer: >60 mL/min (ref >60)
GFR calc non Af Amer: >60 mL/min (ref >60)
Sodium: 132 mEq/L — ABNORMAL LOW (ref 135–145)
Total Bilirubin: 0.3 mg/dL (ref 0.3–1.2)
Total Protein: 9 g/dL — ABNORMAL HIGH (ref 6.0–8.3)

## 2011-01-16 LAB — DIFFERENTIAL
Basophils Absolute: 0 10*3/uL (ref 0.0–0.1)
Basophils Relative: 0 % (ref 0–1)
Eosinophils Absolute: 0 10*3/uL (ref 0.0–0.7)
Eosinophils Relative: 0 % (ref 0–5)
Lymphocytes Relative: 5 % — ABNORMAL LOW (ref 12–46)
Lymphs Abs: 0.6 10*3/uL — ABNORMAL LOW (ref 0.7–4.0)
Monocytes Absolute: 0.3 10*3/uL (ref 0.1–1.0)
Monocytes Relative: 3 % (ref 3–12)
Neutro Abs: 9.9 10*3/uL — ABNORMAL HIGH (ref 1.7–7.7)
Neutrophils Relative %: 91 % — ABNORMAL HIGH (ref 43–77)

## 2011-01-16 LAB — CBC
HCT: 44.9 % (ref 36.0–46.0)
Hemoglobin: 15.3 g/dL — ABNORMAL HIGH (ref 12.0–15.0)
MCH: 31.2 pg (ref 26.0–34.0)
MCHC: 34.1 g/dL (ref 30.0–36.0)
MCV: 91.4 fL (ref 78.0–100.0)
Platelets: 319 10*3/uL (ref 150–400)
RBC: 4.91 MIL/uL (ref 3.87–5.11)
RDW: 12.6 % (ref 11.5–15.5)
WBC: 10.8 10*3/uL — ABNORMAL HIGH (ref 4.0–10.5)

## 2011-01-16 LAB — URINALYSIS, ROUTINE W REFLEX MICROSCOPIC
Glucose, UA: NEGATIVE mg/dL
Ketones, ur: >80 mg/dL — AB
Nitrite: NEGATIVE
Specific Gravity, Urine: 1.029 (ref 1.005–1.030)
Urobilinogen, UA: 0.2 mg/dL (ref 0.0–1.0)

## 2011-01-16 LAB — URINE MICROSCOPIC-ADD ON

## 2011-01-16 LAB — LIPASE, BLOOD: Lipase: 40 U/L (ref 11–59)

## 2011-01-16 LAB — POCT PREGNANCY, URINE: Preg Test, Ur: NEGATIVE

## 2011-01-17 ENCOUNTER — Inpatient Hospital Stay (HOSPITAL_COMMUNITY): Payer: Federal, State, Local not specified - PPO

## 2011-01-17 LAB — VITAMIN B12: Vitamin B-12: 398 pg/mL (ref 211–911)

## 2011-01-17 LAB — CBC
MCHC: 34.1 g/dL (ref 30.0–36.0)
MCV: 92.1 fL (ref 78.0–100.0)
Platelets: 224 10*3/uL (ref 150–400)
RDW: 12.7 % (ref 11.5–15.5)
WBC: 6.4 10*3/uL (ref 4.0–10.5)

## 2011-01-17 LAB — LIPID PANEL
Cholesterol: 181 mg/dL (ref 0–200)
HDL: 65 mg/dL (ref >39)
Total CHOL/HDL Ratio: 2.8 RATIO
VLDL: 8 mg/dL (ref 0–40)

## 2011-01-17 LAB — BASIC METABOLIC PANEL
BUN: 8 mg/dL (ref 6–23)
Chloride: 105 mEq/L (ref 96–112)
Creatinine, Ser: 0.52 mg/dL (ref 0.50–1.10)
GFR calc Af Amer: >60 mL/min (ref >60)
GFR calc non Af Amer: >60 mL/min (ref >60)

## 2011-01-17 MED ORDER — IOHEXOL 300 MG/ML  SOLN
100.0000 mL | Freq: Once | INTRAMUSCULAR | Status: AC | PRN
  Administered 2011-01-17: 100 mL via INTRAVENOUS

## 2011-01-18 ENCOUNTER — Inpatient Hospital Stay (HOSPITAL_COMMUNITY): Payer: Federal, State, Local not specified - PPO

## 2011-01-18 LAB — CBC
HCT: 33.7 % — ABNORMAL LOW (ref 36.0–46.0)
Hemoglobin: 11 g/dL — ABNORMAL LOW (ref 12.0–15.0)
MCH: 30.9 pg (ref 26.0–34.0)
MCV: 94.7 fL (ref 78.0–100.0)
Platelets: 217 10*3/uL (ref 150–400)
RBC: 3.56 MIL/uL — ABNORMAL LOW (ref 3.87–5.11)

## 2011-01-18 LAB — BASIC METABOLIC PANEL
BUN: 5 mg/dL — ABNORMAL LOW (ref 6–23)
CO2: 28 mEq/L (ref 19–32)
Calcium: 8.3 mg/dL — ABNORMAL LOW (ref 8.4–10.5)
Chloride: 108 mEq/L (ref 96–112)
Creatinine, Ser: 0.58 mg/dL (ref 0.50–1.10)
Glucose, Bld: 90 mg/dL (ref 70–99)

## 2011-01-26 NOTE — Discharge Summary (Signed)
[name], [name] NO.:  [id number]  MEDICAL RECORD NO.:  [id number]  LOCATION:  1532                         FACILITY:  [facility]  PHYSICIAN:  Calvert Cantor, M.D.     DATE OF BIRTH:  16-Feb-1966  DATE OF ADMISSION:  01/16/2011 DATE OF DISCHARGE:  01/19/2011                              DISCHARGE SUMMARY   PRIMARY CARE PHYSICIAN:  Caryn Bee L. Little, M.D.  GASTROENTEROLOGIST:  Shirley Friar, MD.  PRESENTING COMPLAINT:  Abdominal pain, nausea, vomiting, and bloating  DISCHARGE DIAGNOSES: 1. Small bowel obstruction, resolved with conservative treatment. 2. Ulceration in the ileum. 3. Lymphadenopathy with calcified lymph nodes incidentally noted on CT     of the abdomen and pelvis.  PAST MEDICAL HISTORY: 1. Degenerative disk disease. 2. Hypothyroidism. 3. Gastroesophageal reflux disease. 4. Allergic rhinitis. 5. Mitral valve prolapse. 6. Endometriosis. 7. Cervical dysplasia.  DISCHARGE MEDICATIONS:  Dr. Ewing Schlein has told the patient that he will be calling in medications for nausea and abdominal spasms.  According to his last note, it was like that there will be some Phenergan and some sort of suppository, anyhow, I am not adding any new medications myself. The patient was on the following and the patient can be discharged home the following; 1. Bepreve 1.5% ophthalmic solution 1 to 2 drops in both eyes daily as     needed for allergies. 2. Hydrocodone/acetaminophen 5/325 one tablet status needed for pain. 3. Levothyroxine 25 mcg 1 tablet daily. 4. Methocarbamol 500 mg every 8 hours as needed, 5. Nasonex 1 spray daily in each nostril. 6. Nexium 40 mg daily. 7. Ranitidine 300 mg daily at bedtime. 8. Tylenol Extra Strength 500 mg 2 tablets every 6 hours as needed for     headache, pain, and fever.  CONSULTANTS:  Dr. Ewing Schlein with GI.  PROCEDURES:  On January 17, 2011, a CT of the abdomen and pelvis with contrast; dilated small-bowel loops with  decompressed distal small bowel loops.  Finding compatible with small destruction.  Of note, exact transition point is not visualized, but likely in the right lower quadrant.  Incidentally noted is a circum aortic left renal vein.  X-ray of abdomen on January 17, 2011, stable; malpositioned left tubal ligation clip posterior to the superior aspect of the liver.  Interval minimal atelectasis in the left lung base.  Antral borderline cardiomegaly.  Continued pattern of small bowel obstruction.  No nasogastric tube is present.  X-ray of the abdomen on January 18, 2011, resolving small bowel obstruction.  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  HOSPITAL COURSE:  This is a 45 year old female who came in with the above-mentioned abdominal symptoms.  She was found to have a small bowel obstruction and treated conservatively.  The patient did not require an NG tube as she was not vomiting after being admitted to the hospital. The patient was made n.p.o. and received IV fluids, and slowly the obstruction did resolve.  This morning she was able to tolerate a regular diet without any discomfort.  Dr. Ewing Schlein was consulted as the patient has had ileal ulcers in the recent past.  She was seen by Dr. Bosie Clos who did the initial colonoscopy  and biopsies.  At that time, biopsy results did not reveal any specific etiology.  Specifically, there were no active inflammation or granulomas present that would be consistent with an IVD.  The patient subsequently went to [facility] and had a capsule endoscopy performed, this was about a month ago.  She was told that according to that endoscopy, the ulcers were "in remission."  Due to this current episode of obstruction, she will need to follow up with GI once again. Dr. Ewing Schlein is recommending that she follow up with Dr. Bosie Clos in 1 to 2 weeks.  Of note, the patient was found to have some calcified lymph nodes and also look like a calcified granuloma in the  right lung base, this can be further evaluated by her primary care physician.  CONDITION ON DISCHARGE:  Stable.  PHYSICAL EXAMINATION:  ABDOMEN:  Soft, mild tenderness in right lower quadrant.  Bowel sounds are positive.  Abdomen is not distended. LUNGS:  Clear. HEART:  Regular rate and rhythm.  No murmurs.  FOLLOWUP INSTRUCTIONS: 1. Follow up with Dr. Bosie Clos in 1 to 2 weeks. 2. Follow with primary care physician Dr. Catha Gosselin as needed.  TIME OF CARE:  45 minutes.     Calvert Cantor, M.D.     SR/MEDQ  D:  01/19/2011  T:  01/19/2011  Job:  [id number]  cc:   Caryn Bee L. Little, M.D. Fax: [phone]  Shirley Friar, MD Fax: [phone]  Electronically Signed by Calvert Cantor M.D. on 01/26/2011 02:21:57 PM

## 2011-01-28 ENCOUNTER — Telehealth: Payer: Self-pay

## 2011-01-28 NOTE — Telephone Encounter (Signed)
Pt wanted to see Dr. Marina Goodell for a second opinion. Pt is established with [facility]. Pt informed per Dr. Marina Goodell that he does not perform second opinions on pts that already have an established GI physician here in town. Suggested pt get a referral from her current GI group for a second opinion at a tertiary care center. Pt verbalized understanding. Records mailed back to the pt.

## 2011-02-10 NOTE — Consult Note (Signed)
NAMEMADA, SADIK NO.:  [id number]  MEDICAL RECORD NO.:  [id number]  LOCATION:  1532                         FACILITY:  [facility]  PHYSICIAN:  Petra Kuba, M.D.    DATE OF BIRTH:  [birth year]  DATE OF CONSULTATION:  01/17/2011 DATE OF DISCHARGE:                                CONSULTATION   HISTORY:  The patient is well known to my partner, Charlott Rakes, with a complicated case for questionable Crohn versus nonsteroidal- induced GI ulcers, who has had multiple spells about every 6 months of lower abdominal pain, cramps with some nausea, vomiting, and decreased bowel movements.  She had been fine she says for at least 6 months, although a capsule endoscopy done this summer did show a few ulcers in her GI and she was referred to [facility] for them to evaluate her history and get their thoughts, although they were without a definitive diagnosis.  She has had endometriosis surgery in 2009 as well as a tubal ligation, but no other abdominal surgeries.  She also has had some back surgeries.  Currently, she is doing better, although yesterday's CAT scan showed a possible small bowel obstruction.  Today's x-ray seems improved with air in the colon.  Her past medical history is pertinent for DJD of her lower back hypothyroidism, history of reflux.  ALLERGIES:  Mitral valve prolapse, endometriosis, tubal ligation as above and questionable small bowel ulcers.  MEDICINES AT HOME:  Nexium, Synthroid, Nasonex, Zantac, and occasional hydrocodone.  SOCIAL HISTORY:  She denies any drug or tobacco with rare alcohol use. She uses no aspirin or nonsteroidals.  FAMILY HISTORY:  Negative for any inflammatory bowel disease.  REVIEW OF SYSTEMS:  Negative except above.  She does work full time and this came on her an hour after eating at [facility] without any significant at-risk meals or sick contacts.  PHYSICAL EXAMINATION:  GENERAL:  No acute distress. VITAL SIGNS:   Stable, afebrile. ABDOMEN:  Very minimally sore in the lower quadrants.  No guarding or rebound.  Good bowel sounds.  Good peripheral pulses.  No pedal edema.  LABORATORY DATA:  Labs pertinent for a normal B12, TSH, and free T4. CBC is normal with exception of slightly low hemoglobin of 11.5 with an MCV of 92.  Normal white count and platelet count.  Complete metabolic profile was normal including normal BUN and creatinine.  Liver tests and albumin of 4.5.  Sed rate is normal.  Lipase normal which was little hydrated on urinalysis and did have a little bit of blood as well.  CAT scan pertinent for some dilated small bowel loops with decompressed distal small bowel compatible with a small bowel obstruction, but no other obvious findings.  ASSESSMENT: 1. 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 2. Small bowel obstruction, improving questionably secondary to #1 for     adhesions or endometriosis.  PLAN:  We will clear liquids if she continues to improve.  Hopefully, she can go home soon and followup with Dr. Bosie Clos fairly soon in the office to decide any further workup plans.  We will follow with you.  ______________________________ Petra Kuba, M.D.     MEM/MEDQ  D:  01/17/2011  T:  01/18/2011  Job:  [id number]  cc:   Shirley Friar, MD Fax: [phone]  Electronically Signed by Vida Rigger M.D. on 02/10/2011 02:55:19 PM

## 2011-03-04 NOTE — H&P (Signed)
[name], AUERBACH NO.:  [id number]  MEDICAL RECORD NO.:  [id number]  LOCATION:  WLED                         FACILITY:  [facility]  PHYSICIAN:  Rosanna Randy, MDDATE OF BIRTH:  12-Apr-1966  DATE OF ADMISSION:  01/16/2011 DATE OF DISCHARGE:                             HISTORY & PHYSICAL   CHIEF COMPLAINT:  Abdominal pain, nausea, vomiting, bloating sensation.  HISTORY OF PRESENT ILLNESS:  The patient is a 45 year old female with a past medical history significant for hypothyroidism, gastroesophageal reflux disease, allergic rhinitis, mitral valve prolapse, and also history of degenerative disk disease in her lower back who came in to the hospital complaining of severe abdominal pain with associated nausea, vomiting, and bloating sensation for the last 36 hours prior to admission.  The patient reports that she had been followed by Dr. Bosie Clos closely and also by Dr. Kathi Simpers at [facility] secondary to severe abdominal pain for the last 6 months.  A recent capsule endoscopy performed in July 2012 demonstrated some ulcers throughout the small bowel that were in remission at that time, suggesting inflammatory bowel disease.  The patient is not currently taking any medication to suppress this problem.  She reports no diarrhea.  No melena or hematochezia.  The patient also reports having a bowel movement the day prior to have worsening of her symptoms and denies any unusual food intake that could potentially precipitate or worsen her condition.  While she was in the emergency department, a CT of the abdomen and pelvis demonstrated dilated small bowel loops with a decompressed distal small bowel findings compatible with a small bowel obstruction.  Due to the inability to keep things down and having such a significant pain in her belly, [facility] hospitalist was called for further evaluation and treatment of this patient.  ALLERGIES:  No known drug  allergy.  PAST MEDICAL HISTORY: 1. Degenerative disk disease in her lower back. 2. Hypothyroidism. 3. Gastroesophageal reflux disease. 4. Allergic rhinitis. 5. Mitral valve prolapse. 6. Also with history of endometriosis and history of a cervical     dysplasia in the past.  MEDICATIONS:  A formal med rec has not been done at the moment of this dictation, but looking into her records and discussing with the patient's, she is on Nexium, ranitidine, Synthroid, Nasonex p.r.n. for her allergic rhinitis, and also on a p.r.n. regimen for hydrocodone and Robaxin for her back pain.  SOCIAL HISTORY:  The patient is married, lives with her husband here in [place].  She denies any illicit drugs, also denies any tobacco abuse.  She reports social alcohol intake.  FAMILY HISTORY:  Significant just for epilepsy in her mom, otherwise noncontributory.  REVIEW OF SYSTEMS:  Positive for chills, otherwise negative except as mentioned on HPI.  PHYSICAL EXAMINATION:  VITAL SIGNS:  Temperature 98.6, blood pressure 128/87, respiratory rate 22, heart rate 77, oxygen saturation 96% on room air. GENERAL:  The patient is in mild distress secondary to abdominal pain, but able to communicate and cooperate with examination.  HEENT: Normocephalic.  No trauma.  Eyes, PERRLA.  Extraocular muscles intact. No icterus.  No nystagmus.  There are no discharges coming out  of her ears or out of her nostrils.  No tenderness on her paranasal sinuses. There is dry mucous membranes without erythema or exudates inside her mouth. NECK:  Supple.  No thyromegaly.  No bruits. RESPIRATORY SYSTEM:  Clear to auscultation bilaterally symmetrically. HEART:  1/6 systolic murmur, regular rate and rhythm.  No gallops.  No rubs. ABDOMEN:  Positive bowel sounds.  Tenderness to palpation throughout, but specifically in the midsection of her belly, extending across right to left.  There are no positive rebound signs.  Negative  guarding.  No hepatomegaly or splenomegaly was appreciated.  There is mild distention. EXTREMITIES:  Trace of edema bilaterally up to her ankles.  No cyanosis. No clubbing. SKIN:  No rash.  No petechiae. NEUROLOGIC:  Alert, awake, and oriented x3.  Cranial nerves II through XII intact.  Muscle strength 5/5 bilaterally symmetrically. Normal deep tendon reflexes.  PERTINENT LABORATORY DATA:  The patient with a negative pregnancy urine test.  CBC with white blood cells of 10.8, hemoglobin 15.3, MCV 91.4, platelets 319, absolute neutrophils 9.9.  Urinalysis with a specific gravity of 1.029, moderate amount of blood, 100 protein, negative nitrite, negative leukocytes.  Microscopy was normal.  Comprehensive metabolic panel demonstrated a sodium of 132, potassium 5.2 with a moderate hemolysis, chloride 95, bicarbonate 24, glucose 121, BUN 13, creatinine 0.59.  Liver function tests essentially normal.  Lipase was 40.  A CT of the abdomen and pelvis as mentioned on HPI demonstrated dilated small bowel loops with a decompressed distal small bowel loops, findings are compatible with a small bowel obstruction.  ASSESSMENT AND PLAN: 1. Abdominal pain, nausea, vomiting secondary to small bowel     obstruction.  At this point, we are going to put the patient on     n.p.o. in order to provide bowel rest.  We are going to provide     fluid resuscitation initially using just normal saline and if the     n.p.o. status continue, then we will need to change her IV fluids     to dextrose 5% half normal saline.  We will allow the patient to     have ice chips in order to provide some comfort and to give her     mouth moist.  We will provide pain medications and antiemetics.  We     will order erythrosedimentation rate, CRP, B12 level, vitamin D     level, a TSH, magnesium, and phosphorus.  At this point, we are     going to hold off on NG tube placement since there is no     significant distention in the  upper part of her GI tract and she is     currently not vomiting, but it is certain that she may require if     her symptoms worsen.  We are going to contact the patient's GI     physician in the morning for further recommendations and treatment     of her small bowel obstruction that might be associated to her     inflammatory bowel disease.  If her condition failed to improve     with a conservative therapy, she will require to be placed on     prednisone and at that moment antibiotics including Cipro and     Flagyl will be started.  For now, we will go ahead and hold on     antibiotics.  As part of the workup, a repeat followup 2-view x-ray  in the morning in order to assess further distention or free air. 2. Dehydration.  We are going to provide fluid resuscitation. 3. Hypothyroidism.  We are going to check a TSH level and also a     fasting lipid panel.  We are going to continue using Synthroid and     we will go ahead and adjust the doses as needed. 4. Gastroesophageal reflux disease.  We will go ahead and continue the     patient PPI and H2 blocker. 5. Hyperglycemia without history of diabetes.  We will go ahead and     check another fasting glucose and we will check a hemoglobin A1c. 6. History of degenerative disk disease which is stable at this point.     We will provide relieve her pain using     morphine.  Further evaluation and treatment depending the patient's     evolution. 7. Deep vein thrombosis prophylaxis.  The patient is going to be     started on heparin 5000 units subcutaneously every 8 hours.     Rosanna Randy, MD     CEM/MEDQ  D:  01/17/2011  T:  01/17/2011  Job:  [id number]  cc:   Caryn Bee L. Little, M.D. Fax: [phone]  Shirley Friar, MD Fax: [phone]  Electronically Signed by Vassie Loll MD on 03/04/2011 07:44:13 AM

## 2011-03-08 LAB — POCT URINALYSIS DIP (DEVICE)
Glucose, UA: NEGATIVE
Nitrite: NEGATIVE
Operator id: 235561
Protein, ur: 30 — AB
Specific Gravity, Urine: 1.015
Urobilinogen, UA: 1
pH: 6

## 2011-03-08 LAB — POCT PREGNANCY, URINE
Operator id: 235561
Preg Test, Ur: NEGATIVE

## 2011-03-08 LAB — I-STAT 8, (EC8 V) (CONVERTED LAB)
Acid-Base Excess: 3 — ABNORMAL HIGH
BUN: 13
Bicarbonate: 27.9 — ABNORMAL HIGH
Chloride: 102
Glucose, Bld: 94
HCT: 45
Hemoglobin: 15.3 — ABNORMAL HIGH
Operator id: 235561
Potassium: 4
Sodium: 136
TCO2: 29
pCO2, Ven: 42.8 — ABNORMAL LOW
pH, Ven: 7.422 — ABNORMAL HIGH

## 2011-03-08 LAB — DIFFERENTIAL
Basophils Absolute: 0
Basophils Relative: 0
Eosinophils Absolute: 0.1
Eosinophils Relative: 2
Lymphocytes Relative: 21
Lymphs Abs: 1.2
Monocytes Absolute: 0.7
Monocytes Relative: 11
Neutro Abs: 3.8
Neutrophils Relative %: 65

## 2011-03-08 LAB — CBC
HCT: 40.4
Hemoglobin: 13.8
RDW: 12.5
WBC: 5.9

## 2011-03-08 LAB — POCT I-STAT CREATININE: Operator id: 235561

## 2011-11-12 IMAGING — CR DG ABDOMEN ACUTE W/ 1V CHEST
3 series · 3 of 3 positions shown · non-contrast
Comparison: Chest dated 09/02/2009, abdomen radiographs dated
12/07/2010 and abdomen and pelvis CT dated 01/17/2011.

CLINICAL DATA: Abdominal pain.  Follow-up small bowel obstruction.

ACUTE ABDOMEN SERIES (ABDOMEN 2 VIEW & CHEST 1 VIEW)

[w chest pa]
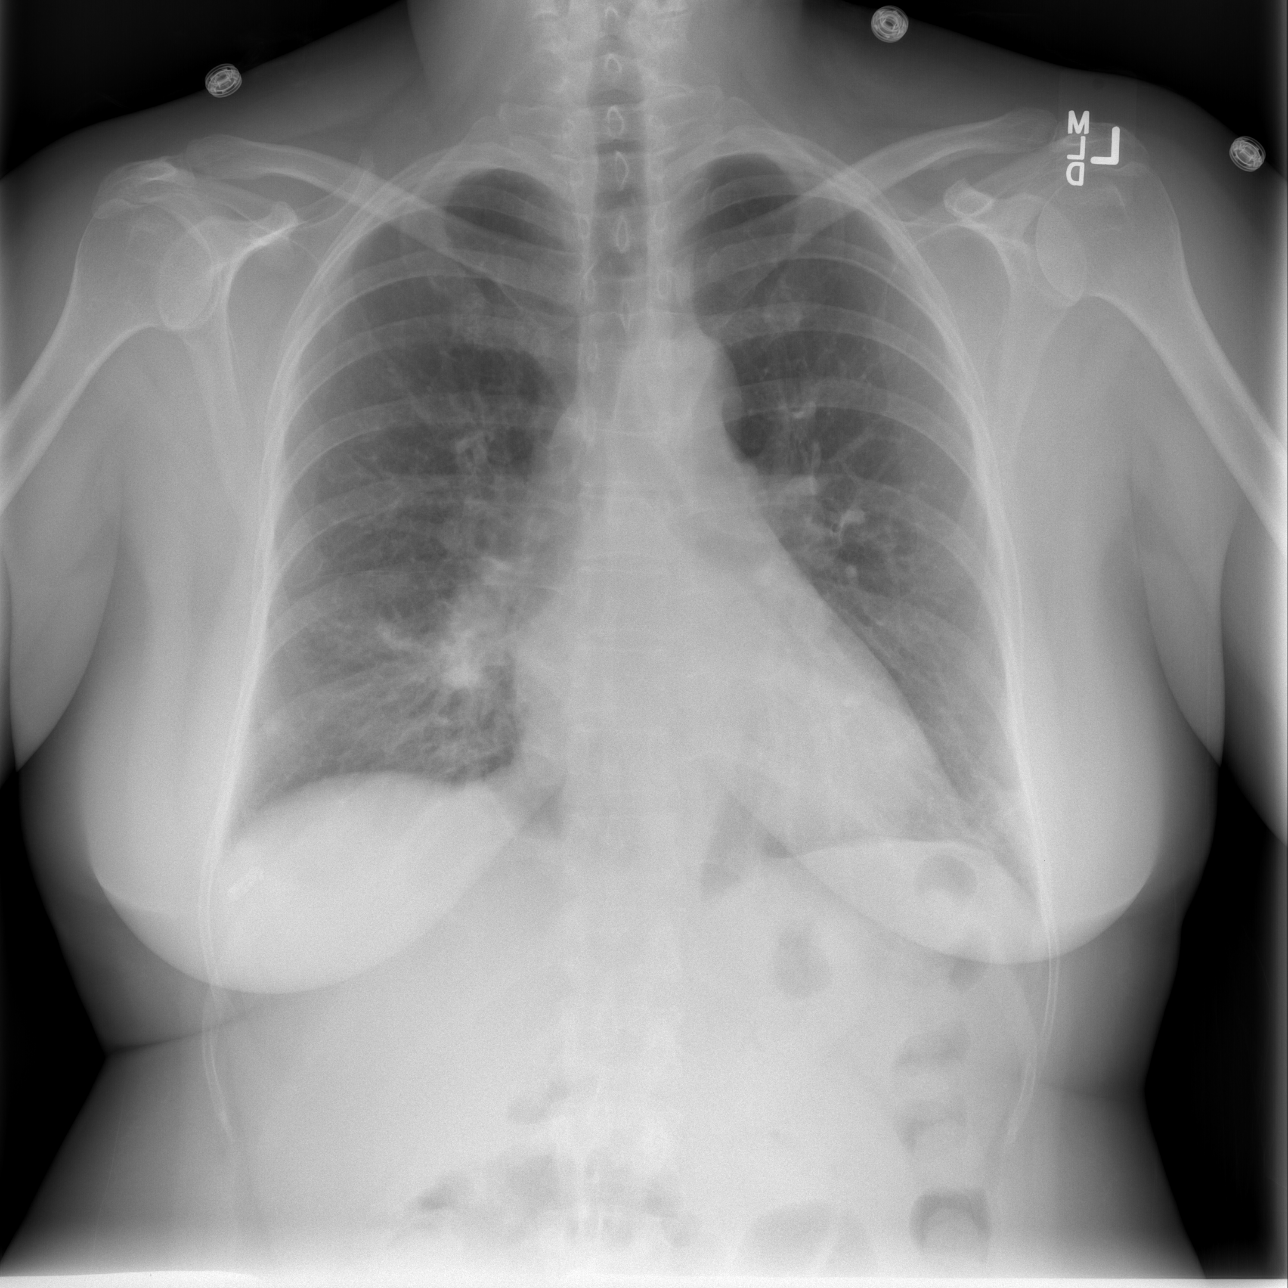

[w abdomen upright *]
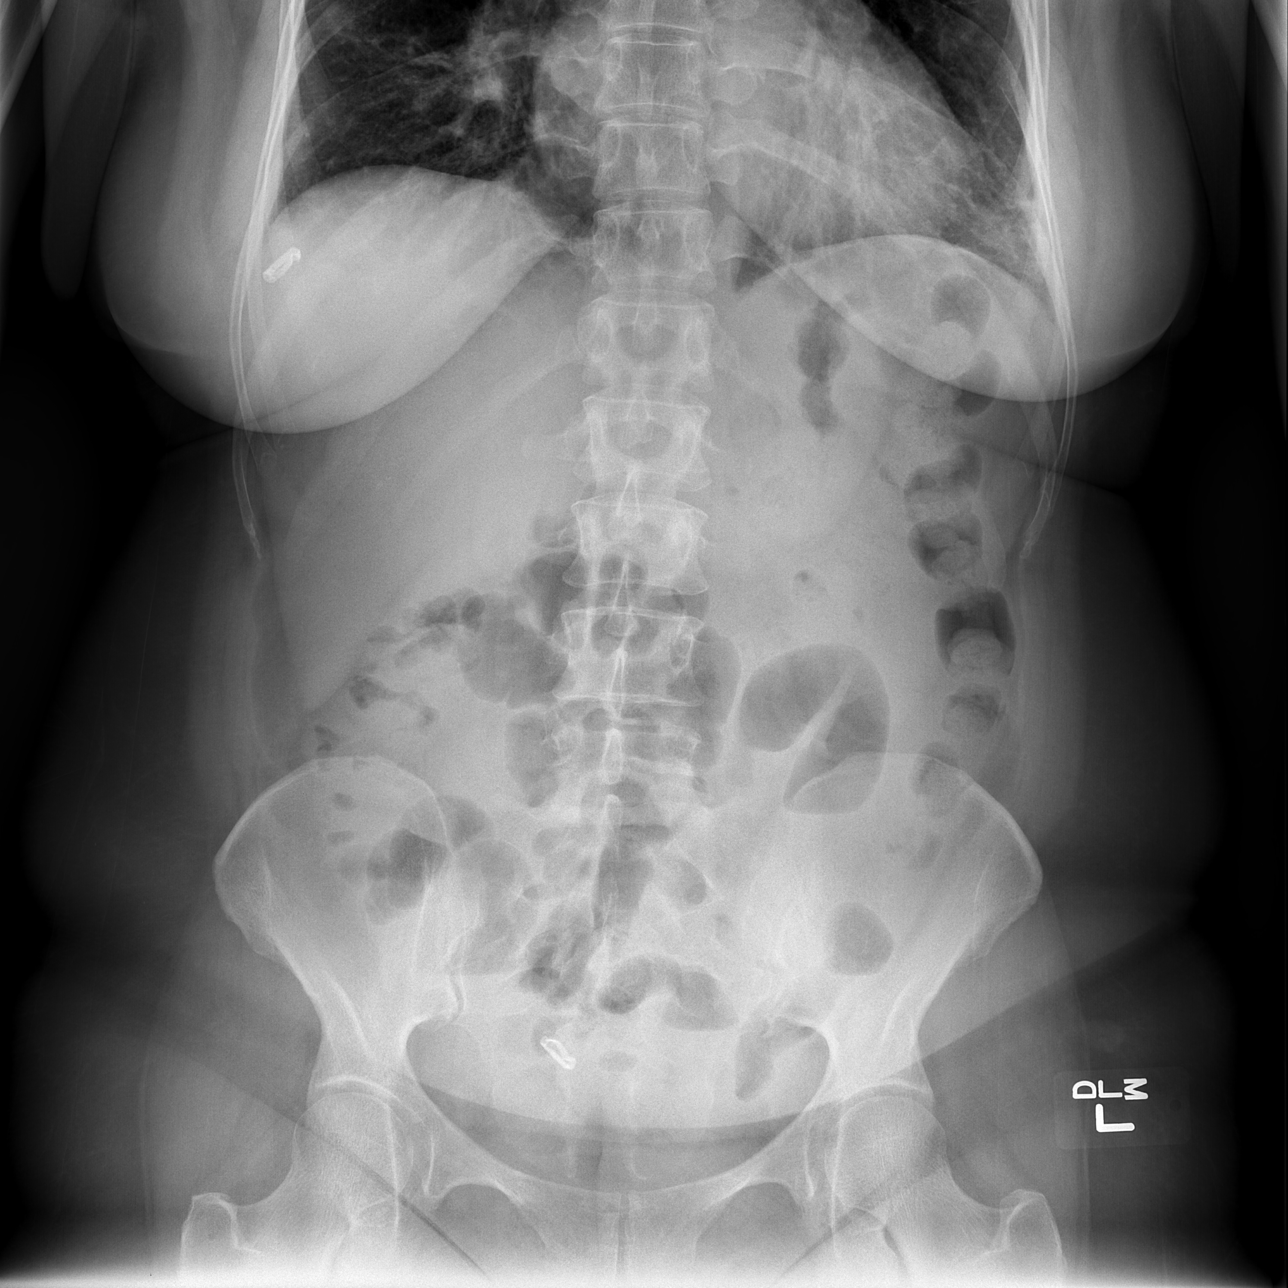

[t abdomen supine]
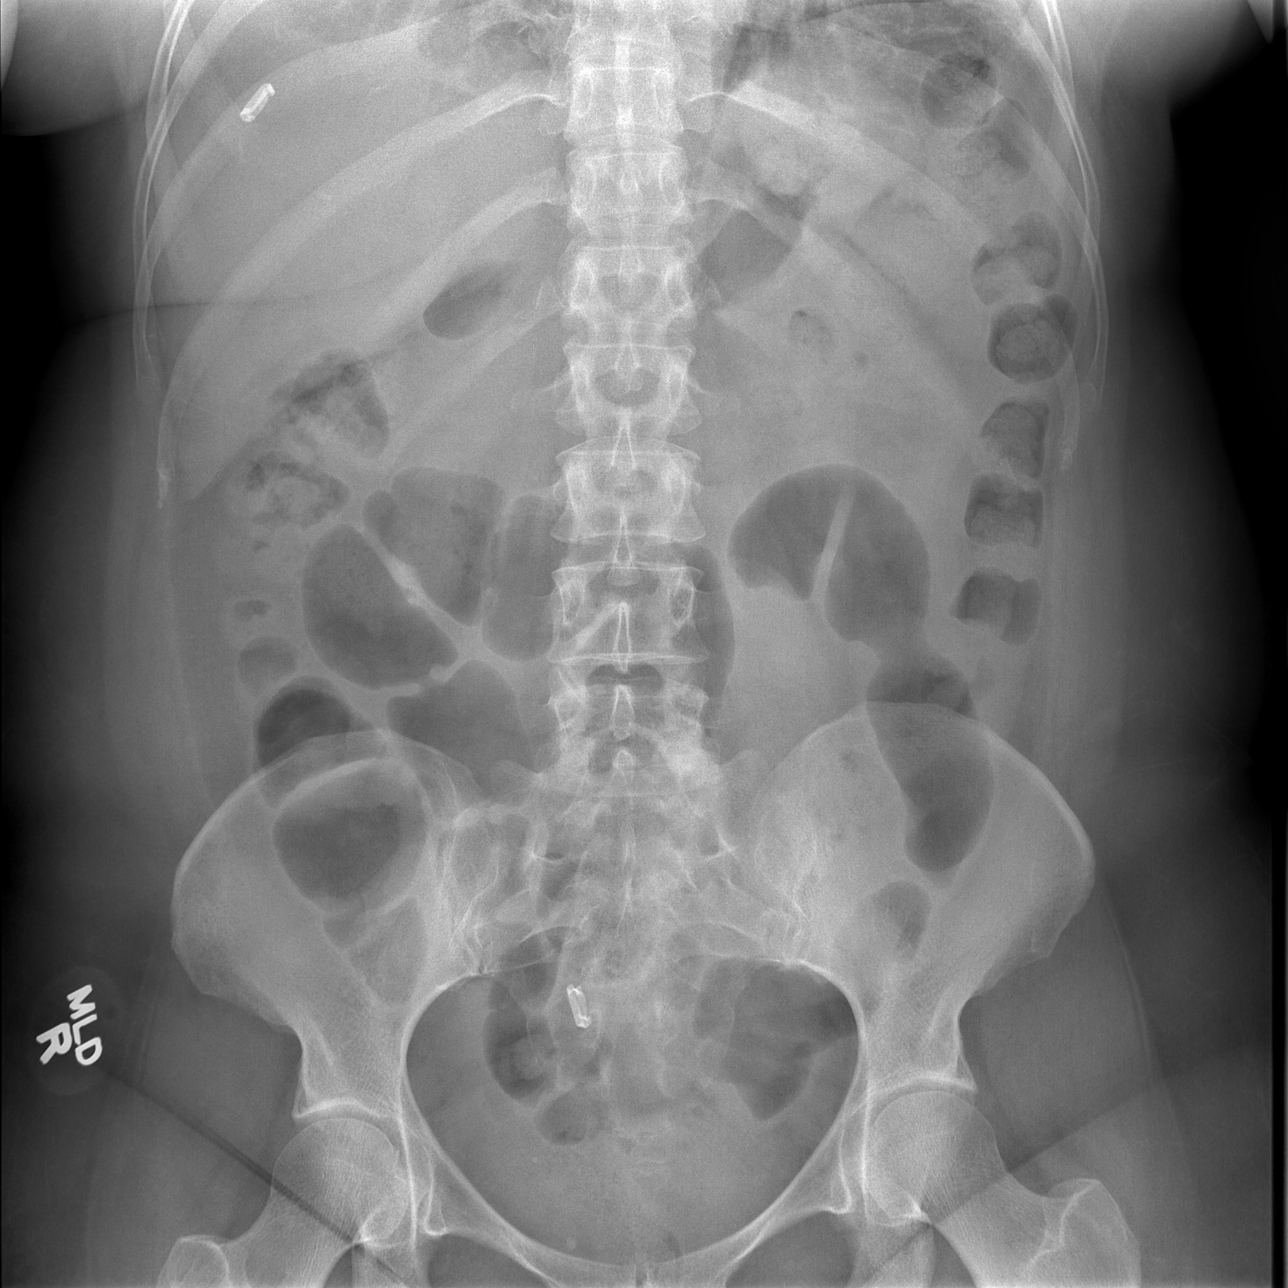

[3 of 3 positions shown; findings below may reference images not displayed]

FINDINGS: Interval borderline enlargement of the cardiac
silhouette.  Interval small amount of predominately linear density
at the left lung base.  Stable small calcified granuloma at the
right lateral lung base.

A metallic density is noted in the superior aspect of the right
upper quadrant of the abdomen, laterally.  This is abutting the
liver and adjacent pleura on the recent CT.  This has appearance
similar to tubal ligation clips seen in the pelvis on the right.

Mildly to moderately dilated loops of ileum in the lower abdomen.
Gas and stool in normal-caliber colon.  No free peritoneal air.
Unremarkable bones.
IMPRESSION: 1.  Stable malpositioned left tubal ligation clip posterior to the
superior aspect of the liver.
2.  Interval minimal atelectasis at the left lung base.
3.  Interval borderline cardiomegaly.
4.  Continued pattern of partial small bowel obstruction.  No
nasogastric tube is in place.

## 2012-05-04 ENCOUNTER — Emergency Department (HOSPITAL_COMMUNITY): Payer: Federal, State, Local not specified - PPO

## 2012-05-04 ENCOUNTER — Inpatient Hospital Stay (HOSPITAL_COMMUNITY)
Admission: EM | Admit: 2012-05-04 | Discharge: 2012-05-07 | DRG: 180 | Disposition: A | Discharge status: Home / Self Care | Payer: Federal, State, Local not specified - PPO | Attending: Family Medicine | Admitting: Family Medicine

## 2012-05-04 ENCOUNTER — Inpatient Hospital Stay (HOSPITAL_COMMUNITY): Payer: Federal, State, Local not specified - PPO

## 2012-05-04 ENCOUNTER — Encounter (HOSPITAL_COMMUNITY): Payer: Self-pay | Admitting: Emergency Medicine

## 2012-05-04 DIAGNOSIS — E039 Hypothyroidism, unspecified: Secondary | ICD-10-CM | POA: Diagnosis present

## 2012-05-04 DIAGNOSIS — I059 Rheumatic mitral valve disease, unspecified: Secondary | ICD-10-CM | POA: Diagnosis present

## 2012-05-04 DIAGNOSIS — K219 Gastro-esophageal reflux disease without esophagitis: Secondary | ICD-10-CM | POA: Diagnosis present

## 2012-05-04 DIAGNOSIS — Z79899 Other long term (current) drug therapy: Secondary | ICD-10-CM

## 2012-05-04 DIAGNOSIS — K56609 Unspecified intestinal obstruction, unspecified as to partial versus complete obstruction: Principal | ICD-10-CM

## 2012-05-04 DIAGNOSIS — K5090 Crohn's disease, unspecified, without complications: Secondary | ICD-10-CM | POA: Diagnosis present

## 2012-05-04 DIAGNOSIS — E871 Hypo-osmolality and hyponatremia: Secondary | ICD-10-CM | POA: Diagnosis present

## 2012-05-04 HISTORY — DX: Endometriosis, unspecified: N80.9

## 2012-05-04 HISTORY — DX: Hypothyroidism, unspecified: E03.9

## 2012-05-04 HISTORY — DX: Unspecified intestinal obstruction, unspecified as to partial versus complete obstruction: K56.609

## 2012-05-04 LAB — CBC WITH DIFFERENTIAL/PLATELET
Basophils Absolute: 0 10*3/uL (ref 0.0–0.1)
Basophils Relative: 0 % (ref 0–1)
Basophils Relative: 0 % (ref 0–1)
Eosinophils Absolute: 0 10*3/uL (ref 0.0–0.7)
Eosinophils Absolute: 0 10*3/uL (ref 0.0–0.7)
Eosinophils Relative: 0 % (ref 0–5)
Eosinophils Relative: 0 % (ref 0–5)
HCT: 42.6 % (ref 36.0–46.0)
HCT: 40.9 % (ref 36.0–46.0)
Hemoglobin: 14 g/dL (ref 12.0–15.0)
MCH: 31.1 pg (ref 26.0–34.0)
MCH: 31.3 pg (ref 26.0–34.0)
MCHC: 34.3 g/dL (ref 30.0–36.0)
MCHC: 34.2 g/dL (ref 30.0–36.0)
MCV: 90.6 fL (ref 78.0–100.0)
MCV: 91.5 fL (ref 78.0–100.0)
Monocytes Absolute: 0.5 10*3/uL (ref 0.1–1.0)
Monocytes Absolute: 0.5 10*3/uL (ref 0.1–1.0)
Monocytes Relative: 5 % (ref 3–12)
Neutro Abs: 7.6 10*3/uL (ref 1.7–7.7)
Neutrophils Relative %: 91 % — ABNORMAL HIGH (ref 43–77)
RDW: 12.6 % (ref 11.5–15.5)

## 2012-05-04 LAB — COMPREHENSIVE METABOLIC PANEL
AST: 20 U/L (ref 0–37)
Albumin: 4.1 g/dL (ref 3.5–5.2)
BUN: 11 mg/dL (ref 6–23)
Calcium: 9.9 mg/dL (ref 8.4–10.5)
Creatinine, Ser: 0.58 mg/dL (ref 0.50–1.10)
Total Protein: 8.1 g/dL (ref 6.0–8.3)

## 2012-05-04 LAB — URINE MICROSCOPIC-ADD ON

## 2012-05-04 LAB — URINALYSIS, ROUTINE W REFLEX MICROSCOPIC
Glucose, UA: NEGATIVE mg/dL
Leukocytes, UA: NEGATIVE
Specific Gravity, Urine: 1.027 (ref 1.005–1.030)
Urobilinogen, UA: 0.2 mg/dL (ref 0.0–1.0)

## 2012-05-04 MED ORDER — ONDANSETRON HCL 4 MG/2ML IJ SOLN
4.0000 mg | Freq: Once | INTRAMUSCULAR | Status: AC
  Administered 2012-05-04: 4 mg via INTRAVENOUS
  Filled 2012-05-04: qty 2

## 2012-05-04 MED ORDER — SODIUM CHLORIDE 0.9 % IV SOLN
INTRAVENOUS | Status: DC
  Administered 2012-05-04 – 2012-05-05 (×3): via INTRAVENOUS

## 2012-05-04 MED ORDER — ONDANSETRON HCL 4 MG/2ML IJ SOLN
4.0000 mg | Freq: Four times a day (QID) | INTRAMUSCULAR | Status: AC
  Administered 2012-05-04 (×2): 4 mg via INTRAVENOUS
  Filled 2012-05-04 (×2): qty 2

## 2012-05-04 MED ORDER — ONDANSETRON HCL 4 MG/2ML IJ SOLN
4.0000 mg | Freq: Three times a day (TID) | INTRAMUSCULAR | Status: DC | PRN
  Administered 2012-05-04: 4 mg via INTRAVENOUS
  Filled 2012-05-04: qty 2

## 2012-05-04 MED ORDER — IOHEXOL 300 MG/ML  SOLN
100.0000 mL | Freq: Once | INTRAMUSCULAR | Status: AC | PRN
  Administered 2012-05-04: 100 mL via INTRAVENOUS

## 2012-05-04 MED ORDER — PANTOPRAZOLE SODIUM 40 MG PO TBEC
40.0000 mg | DELAYED_RELEASE_TABLET | Freq: Every day | ORAL | Status: DC
  Administered 2012-05-05 – 2012-05-07 (×3): 40 mg via ORAL
  Filled 2012-05-04 (×3): qty 1

## 2012-05-04 MED ORDER — HYDROMORPHONE HCL PF 1 MG/ML IJ SOLN
1.0000 mg | Freq: Once | INTRAMUSCULAR | Status: AC
  Administered 2012-05-04: 1 mg via INTRAVENOUS
  Filled 2012-05-04: qty 1

## 2012-05-04 MED ORDER — HYDROMORPHONE HCL PF 1 MG/ML IJ SOLN
1.0000 mg | INTRAMUSCULAR | Status: DC | PRN
  Administered 2012-05-04 – 2012-05-06 (×18): 1 mg via INTRAVENOUS
  Filled 2012-05-04 (×18): qty 1

## 2012-05-04 MED ORDER — SODIUM CHLORIDE 0.9 % IV SOLN
Freq: Once | INTRAVENOUS | Status: AC
  Administered 2012-05-04: 06:00:00 via INTRAVENOUS

## 2012-05-04 MED ORDER — LEVOTHYROXINE SODIUM 50 MCG PO TABS
50.0000 ug | ORAL_TABLET | Freq: Every day | ORAL | Status: DC
  Administered 2012-05-05 – 2012-05-07 (×3): 50 ug via ORAL
  Filled 2012-05-04 (×5): qty 1

## 2012-05-04 MED ORDER — HYDROMORPHONE HCL PF 1 MG/ML IJ SOLN
1.0000 mg | INTRAMUSCULAR | Status: DC | PRN
  Administered 2012-05-04: 1 mg via INTRAVENOUS
  Filled 2012-05-04: qty 1

## 2012-05-04 NOTE — ED Provider Notes (Signed)
History     CSN: [id number]  Arrival date & time 05/04/12  0546   First MD Initiated Contact with Patient 05/04/12 0600      Chief Complaint  Patient presents with  . Abdominal Pain    (Consider location/radiation/quality/duration/timing/severity/associated sxs/prior treatment) HPI Comments: 46 year old female presents with acute onset of abdominal pain last night associated with multiple episodes of nausea and vomiting. The pain has been persistent, gradually worsening, associated with distention of her abdomen. She denies any bowel movements or passing any gas and has had no dysuria. She was diagnosed with a small bowel obstruction from an unknown etiology in August of 2012 and since that time has followed up with gastroenterology. She does not have any definitive answers as to why she had a bowel obstruction in the past.  Patient is a 46 y.o. female presenting with abdominal pain. The history is provided by the patient, medical records and a relative.  Abdominal Pain The primary symptoms of the illness include abdominal pain, nausea and vomiting. The primary symptoms of the illness do not include fever, shortness of breath, diarrhea or dysuria.  Symptoms associated with the illness do not include chills, frequency or back pain.    Past Medical History  Diagnosis Date  . Small bowel obstruction     History reviewed. No pertinent past surgical history.  No family history on file.  History  Substance Use Topics  . Smoking status: Never Smoker   . Smokeless tobacco: Not on file  . Alcohol Use: Yes     Comment: occasional    OB History    Grav Para Term Preterm Abortions TAB SAB Ect Mult Living                  Review of Systems  Constitutional: Negative for fever and chills.  HENT: Negative for sore throat and neck pain.   Eyes: Negative for visual disturbance.  Respiratory: Negative for cough and shortness of breath.   Cardiovascular: Negative for chest pain.   Gastrointestinal: Positive for nausea, vomiting, abdominal pain and abdominal distention. Negative for diarrhea.  Genitourinary: Negative for dysuria and frequency.  Musculoskeletal: Negative for back pain.  Skin: Negative for rash.  Neurological: Negative for weakness, numbness and headaches.  Hematological: Negative for adenopathy.  Psychiatric/Behavioral: Negative for behavioral problems.    Allergies  Review of patient's allergies indicates no known allergies.  Home Medications   Current Outpatient Rx  Name  Route  Sig  Dispense  Refill  . CETIRIZINE HCL 10 MG PO TABS   Oral   Take 10 mg by mouth daily.         Marland Kitchen ESOMEPRAZOLE MAGNESIUM 40 MG PO CPDR   Oral   Take 40 mg by mouth daily before breakfast.         . LEVOTHYROXINE SODIUM 50 MCG PO TABS   Oral   Take 50 mcg by mouth daily.         Marland Kitchen RANITIDINE HCL 150 MG PO TABS   Oral   Take 150 mg by mouth every evening.           BP 137/94  Pulse 94  Temp 97.6 F (36.4 C)  Resp 18  Ht 5' (1.524 m)  Wt 150 lb (68.04 kg)  BMI 29.30 kg/m2  SpO2 99%  LMP 05/04/2012  Physical Exam  Nursing note and vitals reviewed. Constitutional: She appears well-developed and well-nourished.       Uncomfortable appearing  HENT:  Head:  Normocephalic and atraumatic.  Mouth/Throat: Oropharynx is clear and moist. No oropharyngeal exudate.  Eyes: Conjunctivae normal and EOM are normal. Pupils are equal, round, and reactive to light. Right eye exhibits no discharge. Left eye exhibits no discharge. No scleral icterus.  Neck: Normal range of motion. Neck supple. No JVD present. No thyromegaly present.  Cardiovascular: Normal rate, regular rhythm, normal heart sounds and intact distal pulses.  Exam reveals no gallop and no friction rub.   No murmur heard. Pulmonary/Chest: Effort normal and breath sounds normal. No respiratory distress. She has no wheezes. She has no rales.  Abdominal: Soft. She exhibits distension. She exhibits  no mass. Bowel sounds are decreased. There is tenderness ( Diffuse mild tenderness to palpation, no peritoneal signs).       Tympanitic sounds to percussion across the mid abdomen  Musculoskeletal: Normal range of motion. She exhibits no edema and no tenderness.  Lymphadenopathy:    She has no cervical adenopathy.  Neurological: She is alert. Coordination normal.  Skin: Skin is warm and dry. No rash noted. No erythema.  Psychiatric: She has a normal mood and affect. Her behavior is normal.    ED Course  Procedures (including critical care time)  Labs Reviewed  CBC WITH DIFFERENTIAL - Abnormal; Notable for the following:    Neutrophils Relative 88 (*)     Lymphocytes Relative 6 (*)     Lymphs Abs 0.6 (*)     All other components within normal limits  COMPREHENSIVE METABOLIC PANEL  URINALYSIS, ROUTINE W REFLEX MICROSCOPIC   Dg Abd Acute W/chest  05/04/2012  *RADIOLOGY REPORT*  Clinical Data: Abdominal pain and distension.  ACUTE ABDOMEN SERIES (ABDOMEN 2 VIEW & CHEST 1 VIEW)  Comparison: 01/18/2011  Findings: Normal heart size and pulmonary vascularity.  No focal airspace consolidation in the lungs.  No blunting of costophrenic angles.  No pneumothorax.  There is gaseous distension of the upper and mid abdominal small bowel loops with multiple air-fluid levels on the upright view. Changes are consistent with small bowel obstruction.  No free intra- abdominal air.  Stool filled decompressed colon.  No radiopaque stones.  Surgical clip in the pelvis.  Visualized bones appear intact.  IMPRESSION: No evidence of active pulmonary disease.  Gaseous distension of small bowel with air-fluid levels suggesting small bowel obstruction.   Original Report Authenticated By: Burman Nieves, M.D.      1. SBO (small bowel obstruction)       MDM  The patient has a distended abdomen with decreased bowel sounds and abdominal pain which is likely related to a small bowel obstruction. Acute abdominal  series ordered, labs, symptomatic medications and IV fluids.  Much less likely related to pancreatitis, cholecystitis, appendicitis.  The patient has been reevaluated after medications, she feels much better, the x-rays have been reviewed and show a small bowel obstruction. She does not have a leukocytosis. I discussed her care with Dr. Allena Katz of the hospitalist service who will admit.     Vida Roller, MD 05/04/12 [phone]

## 2012-05-04 NOTE — ED Notes (Signed)
Pt c/o lower abd pain onset 2000 last night, +N/V x 5-6 since last night. Pt states she was seen here last year for same pain dx with SBO

## 2012-05-04 NOTE — H&P (Signed)
[facility] History and Physical  [name] ZOX:[id number] DOB: 17-Feb-1966 DOA: 05/04/2012  Referring physician:  PCP: No primary provider on file.  Specialists:  Chief Complaint: Abd pain  HPI: [name] is a 46 y.o. female She c/o lower abd pain last night. It was asso. With N/V for 5 to 6 times. She has h/o SBO. AP is persistent, gradually worsening, associated with distention of her abdomen. She denies any bowel movements or passing any gas and has had no dysuria. She has no fever, chills, CP, shortness of breath, diarrhea or dysuria. CT AP showed SBO.   Review of Systems: The patient denies anorexia, fever, weight loss,, vision loss, decreased hearing, hoarseness, chest pain, syncope, dyspnea on exertion, peripheral edema, balance deficits, hemoptysis, melena, hematochezia, severe indigestion/heartburn, hematuria, incontinence, genital sores, muscle weakness, suspicious skin lesions, transient blindness, difficulty walking, depression, unusual weight change, abnormal bleeding, enlarged lymph nodes, angioedema, and breast masses.    Past Medical History  Diagnosis Date  . Small bowel obstruction   . Hypothyroidism   . Endometriosis   Cervical dysplasia.  Degenerative disk disease in her lower back.  Gastroesophageal reflux disease.  Allergic rhinitis.  Mitral valve prolapse.    PAST SURGICAL HISTORY:  1. Wrist surgery.  2. LEEP procedure in 1996.  Past Surgical History  Procedure Date  . Tubal ligation   . Back surgery   . Right wrist surgery   . Dilation and curettage of uterus    Social History:  reports that she has never smoked. She has never used smokeless tobacco. She reports that she drinks alcohol. She reports that she does not use illicit drugs. No Known Allergies  History reviewed. No pertinent family history.   Prior to Admission medications   Medication Sig Start Date End Date Taking? Authorizing Provider  cetirizine (ZYRTEC) 10 MG tablet Take  10 mg by mouth daily.   Yes Historical Provider, MD  esomeprazole (NEXIUM) 40 MG capsule Take 40 mg by mouth daily before breakfast.   Yes Historical Provider, MD  levothyroxine (SYNTHROID, LEVOTHROID) 50 MCG tablet Take 50 mcg by mouth daily.   Yes Historical Provider, MD  ranitidine (ZANTAC) 150 MG tablet Take 150 mg by mouth every evening.   Yes Historical Provider, MD   Physical Exam: Filed Vitals:   05/04/12 0551 05/04/12 0556 05/04/12 1030  BP: 137/94  137/89  Pulse: 94  81  Temp:  97.6 F (36.4 C) 98.8 F (37.1 C)  TempSrc:   Oral  Resp: 18  18  Height:  5' (1.524 m)   Weight:  68.04 kg (150 lb)   SpO2: 99%  91%     General:  A, O x 3, NAD, lying in bed  Eyes: PEERLA, EOMI  ENT: OM dry, no thrush  Neck: No LN or JVD  Cardiovascular: S1S2 reg, no m/r/g  Respiratory: CTAB, no w/r/c  Abdomen: tender all over, mild tender, BS decreased, no guarding or rigidity  Skin: no rash or bruises  Musculoskeletal: FROM  Psychiatric: no anxiety or depression  Neurologic: no FND  Labs on Admission:  Basic Metabolic Panel:  Lab 05/04/12 8119  NA 134*  K 3.9  CL 101  CO2 21  GLUCOSE 145*  BUN 11  CREATININE 0.58  CALCIUM 9.9  MG --  PHOS --   Liver Function Tests:  Lab 05/04/12 0621  AST 20  ALT 18  ALKPHOS 136*  BILITOT 0.3  PROT 8.1  ALBUMIN 4.1  CBC:  Lab  05/04/12 0621  WBC 8.7  NEUTROABS 7.6  HGB 14.6  HCT 42.6  MCV 90.6  PLT 318   Radiological Exams on Admission: Ct Abdomen Pelvis W Contrast  05/04/2012  *RADIOLOGY REPORT*  Clinical Data: Diffuse abdominal pain.  Nausea and vomiting.  CT ABDOMEN AND PELVIS WITH CONTRAST  Technique:  Multidetector CT imaging of the abdomen and pelvis was performed following the standard protocol during bolus administration of intravenous contrast.  Contrast: OMNIPAQUE IOHEXOL 300 MG/ML  SOLN  Comparison: 01/17/2011  Findings: Moderate dilatation of small bowel loops is seen with a transition point in the  right adnexa adjacent to a surgical clip. This is consistent with a distal small bowel obstruction, likely due to adhesion.  No evidence of soft tissue masses or lymphadenopathy within the abdomen or pelvis.  No evidence of inflammatory process or abscess.  There is a tiny amount of free fluid in the pelvic cul-de-sac.  The abdominal parenchymal organs are normal in appearance.  Gallbladder is unremarkable.  IMPRESSION:  1.  Distal small bowel obstruction with transition point in the right adnexal region, likely due to adhesion. 2.  No other significant abnormality identified.  No evidence of soft tissue mass or inflammatory process.   Original Report Authenticated By: Myles Rosenthal, M.D.    Dg Abd Acute W/chest  05/04/2012  *RADIOLOGY REPORT*  Clinical Data: Abdominal pain and distension.  ACUTE ABDOMEN SERIES (ABDOMEN 2 VIEW & CHEST 1 VIEW)  Comparison: 01/18/2011  Findings: Normal heart size and pulmonary vascularity.  No focal airspace consolidation in the lungs.  No blunting of costophrenic angles.  No pneumothorax.  There is gaseous distension of the upper and mid abdominal small bowel loops with multiple air-fluid levels on the upright view. Changes are consistent with small bowel obstruction.  No free intra- abdominal air.  Stool filled decompressed colon.  No radiopaque stones.  Surgical clip in the pelvis.  Visualized bones appear intact.  IMPRESSION: No evidence of active pulmonary disease.  Gaseous distension of small bowel with air-fluid levels suggesting small bowel obstruction.   Original Report Authenticated By: Burman Nieves, M.D.     Assessment/Plan  1. SBO : med surg bed, bowel rest except ice chips, meds. Follow abd xray. Follow labs, get lactic acid. IV NS, IV pain and antiemetics.   2.   Hyponatremia, mild : will monitor, IV NS 3.   Hypothyroidism : cont home meds 4.   GERD : PPI 5.   SCDs for DVT prophylaxis.   Code Status: full Family Communication:  Disposition Plan: LOS 3  to 4 days  Time spent: 1 hour  [name] V. [facility] Pager [phone] If 7PM-7AM, please contact night-coverage [email or web address] Password [email or web address] 05/04/2012, 11:22 AM

## 2012-05-04 NOTE — ED Notes (Signed)
Pt back from CT, transferred to floor with chart and personal belongings accompanied by family and NT, condition stable at time of transfer.

## 2012-05-04 NOTE — ED Notes (Signed)
Pt completed oral contrast, CT called and informed by this nurse, will monitor.

## 2012-05-04 NOTE — ED Notes (Signed)
Patient transported to CT 

## 2012-05-04 NOTE — ED Notes (Signed)
Patient transported to X-ray 

## 2012-05-05 ENCOUNTER — Inpatient Hospital Stay (HOSPITAL_COMMUNITY): Payer: Federal, State, Local not specified - PPO

## 2012-05-05 DIAGNOSIS — K56609 Unspecified intestinal obstruction, unspecified as to partial versus complete obstruction: Secondary | ICD-10-CM

## 2012-05-05 LAB — COMPREHENSIVE METABOLIC PANEL
ALT: 13 U/L (ref 0–35)
Alkaline Phosphatase: 105 U/L (ref 39–117)
BUN: 10 mg/dL (ref 6–23)
CO2: 26 mEq/L (ref 19–32)
Chloride: 104 mEq/L (ref 96–112)
GFR calc Af Amer: >90 mL/min (ref >90)
Glucose, Bld: 96 mg/dL (ref 70–99)
Potassium: 4.7 mEq/L (ref 3.5–5.1)
Sodium: 138 mEq/L (ref 135–145)
Total Bilirubin: 0.3 mg/dL (ref 0.3–1.2)

## 2012-05-05 MED ORDER — PHENOL 1.4 % MT LIQD
1.0000 | OROMUCOSAL | Status: DC | PRN
  Filled 2012-05-05: qty 177

## 2012-05-05 MED ORDER — POTASSIUM CHLORIDE 2 MEQ/ML IV SOLN
INTRAVENOUS | Status: DC
  Administered 2012-05-05 – 2012-05-06 (×4): via INTRAVENOUS
  Filled 2012-05-05 (×8): qty 1000

## 2012-05-05 MED ORDER — LORAZEPAM 2 MG/ML IJ SOLN
0.5000 mg | Freq: Four times a day (QID) | INTRAMUSCULAR | Status: DC | PRN
  Administered 2012-05-05 – 2012-05-06 (×2): 0.5 mg via INTRAVENOUS
  Filled 2012-05-05 (×2): qty 1

## 2012-05-05 NOTE — Consult Note (Signed)
Reason for Consult: PSBO Referring Physician:  [name] is an 46 y.o. female.  HPI: Patient is a 46 year old female who presents with nausea vomiting and abdominal distention which started late Wednesday evening 05/03/12. She came to the emergency room early in the a.m. of 05/04/12. Labs on admission were essentially normal except for a left shift in her CBC. Abdominal and chest x-ray showed gaseous distention of small bowel with air-fluid levels suggesting a small bowel obstruction.  CT scan shows a distal small bowel obstruction with a transition point in the right adnexal region likely due to an adhesion. The patient was hospitalized last year with a small bowel obstruction in August 2012. She's had recurrent bouts of this about every 6 months for 3 years. She says this starts with bloating then nausea and vomiting. Episodes normally resolved within 24 hours; 12/2010 with her first hospitalization for this problem and this is her second.  She has been evaluated by Dr. Bosie Clos, and has undergone evaluation at [facility] with capsule endoscopy to rule out Crohn's disease or NSAID induced ileal ulcers.  This has been reported negative.  She's had 2 laparoscopic procedures in the past; 03/1203 resection of endometriomas, 5/709 laparoscopic tubal ligation with [place] clips. She returns now with at least partial small bowel obstruction which appears to be improving. She did have a loose bowel movement this morning her and she was 1175 mL yesterday, and is currently clamped. We've been asked to see in consultation by Dr. Irene Limbo.  Past Medical History  Diagnosis Date  . Small bowel obstruction   . Hypothyroidism   . Endometriosis GERD     Past Surgical History  Procedure Date  . Tubal ligation clips 09/28/07 Laparoscopic resection of endometriomas 04/05/03   . Back surgery L5-S1's 09/2010   . Right wrist surgery   . Dilation and curettage of uterus LEEP procedure 1996     History  reviewed. No pertinent family history.  Social History:  reports that she has never smoked. She has never used smokeless tobacco. She reports that she drinks alcohol. She reports that she does not use illicit drugs..  Tobacco: None.  EtOH: Wine with supper daily/about 1 bottle per week.  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  Allergies: No Known Allergies  Medications:  Prior to Admission:  Prescriptions prior to admission  Medication Sig Dispense Refill  . cetirizine (ZYRTEC) 10 MG tablet Take 10 mg by mouth daily.      Marland Kitchen esomeprazole (NEXIUM) 40 MG capsule Take 40 mg by mouth daily before breakfast.      . levothyroxine (SYNTHROID, LEVOTHROID) 50 MCG tablet Take 50 mcg by mouth daily.      . ranitidine (ZANTAC) 150 MG tablet Take 150 mg by mouth every evening.       Scheduled:   . levothyroxine  50 mcg Oral QAC breakfast  . pantoprazole  40 mg Oral Daily   Continuous:   . sodium chloride 100 mL/hr at 05/05/12 0547   ZOX:[id number] (DILAUDID) injection, LORazepam, phenol Anti-infectives    None      Results for orders placed during the hospital encounter of 05/04/12 (from the past 48 hour(s))  CBC WITH DIFFERENTIAL     Status: Abnormal   Collection Time   05/04/12  6:21 AM      Component Value Range Comment   WBC 8.7  4.0 - 10.5 K/uL    RBC 4.70  3.87 - 5.11 MIL/uL  Hemoglobin 14.6  12.0 - 15.0 g/dL    HCT 96.0  45.4 - 09.8 %    MCV 90.6  78.0 - 100.0 fL    MCH 31.1  26.0 - 34.0 pg    MCHC 34.3  30.0 - 36.0 g/dL    RDW 11.9  14.7 - 82.9 %    Platelets 318  150 - 400 K/uL    Neutrophils Relative 88 (*) 43 - 77 %    Neutro Abs 7.6  1.7 - 7.7 K/uL    Lymphocytes Relative 6 (*) 12 - 46 %    Lymphs Abs 0.6 (*) 0.7 - 4.0 K/uL    Monocytes Relative 6  3 - 12 %    Monocytes Absolute 0.5  0.1 - 1.0 K/uL    Eosinophils Relative 0  0 - 5 %    Eosinophils Absolute 0.0  0.0 - 0.7 K/uL    Basophils Relative 0  0 - 1 %    Basophils Absolute 0.0   0.0 - 0.1 K/uL   COMPREHENSIVE METABOLIC PANEL     Status: Abnormal   Collection Time   05/04/12  6:21 AM      Component Value Range Comment   Sodium 134 (*) 135 - 145 mEq/L    Potassium 3.9  3.5 - 5.1 mEq/L    Chloride 101  96 - 112 mEq/L    CO2 21  19 - 32 mEq/L    Glucose, Bld 145 (*) 70 - 99 mg/dL    BUN 11  6 - 23 mg/dL    Creatinine, Ser 5.62  0.50 - 1.10 mg/dL    Calcium 9.9  8.4 - 13.0 mg/dL    Total Protein 8.1  6.0 - 8.3 g/dL    Albumin 4.1  3.5 - 5.2 g/dL    AST 20  0 - 37 U/L    ALT 18  0 - 35 U/L    Alkaline Phosphatase 136 (*) 39 - 117 U/L    Total Bilirubin 0.3  0.3 - 1.2 mg/dL    GFR calc non Af Amer >90  >90 mL/min    GFR calc Af Amer >90  >90 mL/min   URINALYSIS, ROUTINE W REFLEX MICROSCOPIC     Status: Abnormal   Collection Time   05/04/12  7:30 AM      Component Value Range Comment   Color, Urine YELLOW  YELLOW    APPearance CLOUDY (*) CLEAR    Specific Gravity, Urine 1.027  1.005 - 1.030    pH 5.0  5.0 - 8.0    Glucose, UA NEGATIVE  NEGATIVE mg/dL    Hgb urine dipstick SMALL (*) NEGATIVE    Bilirubin Urine NEGATIVE  NEGATIVE    Ketones, ur TRACE (*) NEGATIVE mg/dL    Protein, ur 30 (*) NEGATIVE mg/dL    Urobilinogen, UA 0.2  0.0 - 1.0 mg/dL    Nitrite NEGATIVE  NEGATIVE    Leukocytes, UA NEGATIVE  NEGATIVE   URINE MICROSCOPIC-ADD ON     Status: Abnormal   Collection Time   05/04/12  7:30 AM      Component Value Range Comment   Squamous Epithelial / LPF MANY (*) RARE    WBC, UA 0-2  <3 WBC/hpf    RBC / HPF 0-2  <3 RBC/hpf    Bacteria, UA RARE  RARE   GLUCOSE, CAPILLARY     Status: Abnormal   Collection Time   05/04/12 12:02 PM  Component Value Range Comment   Glucose-Capillary 114 (*) 70 - 99 mg/dL   CBC WITH DIFFERENTIAL     Status: Abnormal   Collection Time   05/04/12 12:41 PM      Component Value Range Comment   WBC 9.7  4.0 - 10.5 K/uL    RBC 4.47  3.87 - 5.11 MIL/uL    Hemoglobin 14.0  12.0 - 15.0 g/dL    HCT 66.4  40.3 - 47.4 %     MCV 91.5  78.0 - 100.0 fL    MCH 31.3  26.0 - 34.0 pg    MCHC 34.2  30.0 - 36.0 g/dL    RDW 25.9  56.3 - 87.5 %    Platelets 310  150 - 400 K/uL    Neutrophils Relative 91 (*) 43 - 77 %    Neutro Abs 8.8 (*) 1.7 - 7.7 K/uL    Lymphocytes Relative 4 (*) 12 - 46 %    Lymphs Abs 0.4 (*) 0.7 - 4.0 K/uL    Monocytes Relative 5  3 - 12 %    Monocytes Absolute 0.5  0.1 - 1.0 K/uL    Eosinophils Relative 0  0 - 5 %    Eosinophils Absolute 0.0  0.0 - 0.7 K/uL    Basophils Relative 0  0 - 1 %    Basophils Absolute 0.0  0.0 - 0.1 K/uL   LACTIC ACID, PLASMA     Status: Normal   Collection Time   05/04/12 12:41 PM      Component Value Range Comment   Lactic Acid, Venous 0.5  0.5 - 2.2 mmol/L   MAGNESIUM     Status: Normal   Collection Time   05/04/12 12:41 PM      Component Value Range Comment   Magnesium 1.9  1.5 - 2.5 mg/dL   MAGNESIUM     Status: Normal   Collection Time   05/05/12  4:27 AM      Component Value Range Comment   Magnesium 2.0  1.5 - 2.5 mg/dL   COMPREHENSIVE METABOLIC PANEL     Status: Abnormal   Collection Time   05/05/12  4:27 AM      Component Value Range Comment   Sodium 138  135 - 145 mEq/L    Potassium 4.7  3.5 - 5.1 mEq/L    Chloride 104  96 - 112 mEq/L    CO2 26  19 - 32 mEq/L    Glucose, Bld 96  70 - 99 mg/dL    BUN 10  6 - 23 mg/dL    Creatinine, Ser 6.43  0.50 - 1.10 mg/dL    Calcium 8.7  8.4 - 32.9 mg/dL    Total Protein 6.4  6.0 - 8.3 g/dL    Albumin 3.1 (*) 3.5 - 5.2 g/dL    AST 15  0 - 37 U/L    ALT 13  0 - 35 U/L    Alkaline Phosphatase 105  39 - 117 U/L    Total Bilirubin 0.3  0.3 - 1.2 mg/dL    GFR calc non Af Amer >90  >90 mL/min    GFR calc Af Amer >90  >90 mL/min     Ct Abdomen Pelvis W Contrast  05/04/2012  *RADIOLOGY REPORT*  Clinical Data: Diffuse abdominal pain.  Nausea and vomiting.  CT ABDOMEN AND PELVIS WITH CONTRAST  Technique:  Multidetector CT imaging of the abdomen and pelvis was performed following the standard protocol  during bolus administration of intravenous contrast.  Contrast: OMNIPAQUE IOHEXOL 300 MG/ML  SOLN  Comparison: 01/17/2011  Findings: Moderate dilatation of small bowel loops is seen with a transition point in the right adnexa adjacent to a surgical clip. This is consistent with a distal small bowel obstruction, likely due to adhesion.  No evidence of soft tissue masses or lymphadenopathy within the abdomen or pelvis.  No evidence of inflammatory process or abscess.  There is a tiny amount of free fluid in the pelvic cul-de-sac.  The abdominal parenchymal organs are normal in appearance.  Gallbladder is unremarkable.  IMPRESSION:  1.  Distal small bowel obstruction with transition point in the right adnexal region, likely due to adhesion. 2.  No other significant abnormality identified.  No evidence of soft tissue mass or inflammatory process.   Original Report Authenticated By: Myles Rosenthal, M.D.    Dg Abd 2 Views  05/05/2012  *RADIOLOGY REPORT*  Clinical Data: Small bowel obstruction  ABDOMEN - 2 VIEW  Comparison: Yesterday  Findings: NG tube has been placed.  It is coiled in the fundus of the stomach.  No free intraperitoneal gas. Mildly distended small bowel loops with air-fluid levels are not significantly changed. There is a gas and stool within nondistended colon.  IMPRESSION: Stable partial small bowel obstruction pattern.  NG tube placed into the stomach.  No free intraperitoneal gas.   Original Report Authenticated By: Jolaine Click, M.D.    Dg Abd Acute W/chest  05/04/2012  *RADIOLOGY REPORT*  Clinical Data: Abdominal pain and distension.  ACUTE ABDOMEN SERIES (ABDOMEN 2 VIEW & CHEST 1 VIEW)  Comparison: 01/18/2011  Findings: Normal heart size and pulmonary vascularity.  No focal airspace consolidation in the lungs.  No blunting of costophrenic angles.  No pneumothorax.  There is gaseous distension of the upper and mid abdominal small bowel loops with multiple air-fluid levels on the upright  view. Changes are consistent with small bowel obstruction.  No free intra- abdominal air.  Stool filled decompressed colon.  No radiopaque stones.  Surgical clip in the pelvis.  Visualized bones appear intact.  IMPRESSION: No evidence of active pulmonary disease.  Gaseous distension of small bowel with air-fluid levels suggesting small bowel obstruction.   Original Report Authenticated By: Burman Nieves, M.D.     Review of Systems  Constitutional: Negative for fever, chills, weight loss, malaise/fatigue and diaphoresis.  HENT: Negative for hearing loss, ear pain, nosebleeds, congestion, tinnitus and ear discharge.   Eyes: Negative.   Respiratory: Negative.  Negative for stridor.   Cardiovascular: Negative.   Gastrointestinal: Positive for heartburn, nausea, vomiting, abdominal pain and diarrhea. Negative for constipation, blood in stool and melena.  Genitourinary: Negative.   Musculoskeletal: Negative.   Skin: Negative for itching and rash.  Neurological: Negative.  Negative for weakness and headaches.  Endo/Heme/Allergies: Negative.   Psychiatric/Behavioral: Negative.    Blood pressure 113/72, pulse 84, temperature 98 F (36.7 C), temperature source Oral, resp. rate 18, height 5' (1.524 m), weight 150 lb (68.04 kg), last menstrual period 05/04/2012, SpO2 92.00%. Physical Exam  Constitutional: She is oriented to person, place, and time. She appears well-developed and well-nourished. No distress.  HENT:  Head: Normocephalic and atraumatic.  Nose: Nose normal.  Eyes: Conjunctivae normal and EOM are normal. Pupils are equal, round, and reactive to light. Right eye exhibits no discharge. Left eye exhibits no discharge.  Neck: Normal range of motion. Neck supple. No JVD present. No tracheal deviation present. No thyromegaly present.  Tender to touch either side of neck  Cardiovascular: Normal rate, regular rhythm, normal heart sounds and intact distal pulses.  Exam reveals no gallop.    No murmur heard. Respiratory: Effort normal and breath sounds normal. No respiratory distress. She has no wheezes. She has no rales. She exhibits no tenderness.  GI: Soft. Bowel sounds are normal. She exhibits distension (mild ). She exhibits no mass. There is tenderness (mild diffuse tenderness). There is no rebound and no guarding.  Musculoskeletal: Normal range of motion. She exhibits no edema and no tenderness.  Lymphadenopathy:    She has no cervical adenopathy.  Neurological: She is alert and oriented to person, place, and time. She has normal reflexes. No cranial nerve deficit.  Skin: Skin is warm and dry. No rash noted. She is not diaphoretic. No erythema.  Psychiatric: She has a normal mood and affect. Her behavior is normal. Judgment and thought content normal.       Anxious and unhappy with the NG tube    Assessment/Plan: 1. Recurrent partial small bowel obstruction, with transition point with right adnexal area near a clip. 2. Status post a laparoscopic procedures as noted above. 3. Hypothyroid on supplement 4.Gerd  PLan:  I would go slow with pulling NG.  If she does well with clamping  Try some clears and if she's doing well with clears and has ongoing BM/Flatus pull NG and advance diet.   Dr. Derrell Lolling will see later this AM.  Will JenningsPA-C for DR. Claud KelpSherrie George 05/05/2012, 10:26 AM

## 2012-05-05 NOTE — Progress Notes (Signed)
[facility] PROGRESS NOTE  [name] JXB:[id number] DOB: 01/10/1966 DOA: 05/04/2012 PCP: No primary provider on file. GASTROENTEROLOGIST: Shirley Friar, MD.  Assessment/Plan: 1. SBO--clinically better but xray with persistent SBO. Consult surgery. Leave NGT clamped for now, repeat film in AM. History of endometriosis surgery in 2009 as well as a tubal ligation, but no other abdominal surgeries. 2. 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 Workup negative by report. 3. Hypothyroidism--Synthroid.  Code Status: full code Family Communication: none present Disposition Plan: home when improved  Brendia Sacks, MD  [facility] Team 6 Pager [phone] If 8PM-8AM, please contact night-coverage at [email or web address], password [facility] 05/05/2012, 9:28 AM  LOS: 1 day   Brief narrative: 46 year old woman presented with abdominal pain, n/v. Imaging revealed SBO.  Consultants:  General surgery  Procedures:    HPI/Subjective: Feels somewhat better, less ab pain. No n/v. Some BM since admission.  Objective: Filed Vitals:   05/04/12 1030 05/04/12 1500 05/04/12 2150 05/05/12 0630  BP: 137/89 108/71 109/74 113/72  Pulse: 81 87 86 84  Temp: 98.8 F (37.1 C) 98.7 F (37.1 C) 97.6 F (36.4 C) 98 F (36.7 C)  TempSrc: Oral Oral Oral Oral  Resp: 18 16 16 18   Height:      Weight:      SpO2: 91% 94% 95% 92%    Intake/Output Summary (Last 24 hours) at 05/05/12 0928 Last data filed at 05/05/12 8469  Gross per 24 hour  Intake 1901.67 ml  Output   2275 ml  Net -373.33 ml   Filed Weights   05/04/12 0556  Weight: 68.04 kg (150 lb)    Exam:  General:  Appears calm and comfortable Cardiovascular: RRR, no m/r/g. No LE edema. Respiratory: CTA bilaterally, no w/r/r. Normal respiratory effort. Abdomen: soft, distended, decreased BS, non-tender Psychiatric: grossly normal mood and affect, speech fluent  and appropriate  Data Reviewed: Basic Metabolic Panel:  Lab 05/05/12 6295 05/04/12 1241 05/04/12 0621  NA 138 -- 134*  K 4.7 -- 3.9  CL 104 -- 101  CO2 26 -- 21  GLUCOSE 96 -- 145*  BUN 10 -- 11  CREATININE 0.72 -- 0.58  CALCIUM 8.7 -- 9.9  MG 2.0 1.9 --  PHOS -- -- --   Liver Function Tests:  Lab 05/05/12 0427 05/04/12 0621  AST 15 20  ALT 13 18  ALKPHOS 105 136*  BILITOT 0.3 0.3  PROT 6.4 8.1  ALBUMIN 3.1* 4.1   CBC:  Lab 05/04/12 1241 05/04/12 0621  WBC 9.7 8.7  NEUTROABS 8.8* 7.6  HGB 14.0 14.6  HCT 40.9 42.6  MCV 91.5 90.6  PLT 310 318   CBG:  Lab 05/04/12 1202  GLUCAP 114*    Studies: Ct Abdomen Pelvis W Contrast  05/04/2012  *RADIOLOGY REPORT*  Clinical Data: Diffuse abdominal pain.  Nausea and vomiting.  CT ABDOMEN AND PELVIS WITH CONTRAST  Technique:  Multidetector CT imaging of the abdomen and pelvis was performed following the standard protocol during bolus administration of intravenous contrast.  Contrast: OMNIPAQUE IOHEXOL 300 MG/ML  SOLN  Comparison: 01/17/2011  Findings: Moderate dilatation of small bowel loops is seen with a transition point in the right adnexa adjacent to a surgical clip. This is consistent with a distal small bowel obstruction, likely due to adhesion.  No evidence of soft tissue masses or lymphadenopathy within the abdomen or pelvis.  No evidence of inflammatory process or abscess.  There is a tiny amount  of free fluid in the pelvic cul-de-sac.  The abdominal parenchymal organs are normal in appearance.  Gallbladder is unremarkable.  IMPRESSION:  1.  Distal small bowel obstruction with transition point in the right adnexal region, likely due to adhesion. 2.  No other significant abnormality identified.  No evidence of soft tissue mass or inflammatory process.   Original Report Authenticated By: Myles Rosenthal, M.D.    Dg Abd 2 Views  05/05/2012  *RADIOLOGY REPORT*  Clinical Data: Small bowel obstruction  ABDOMEN - 2 VIEW   Comparison: Yesterday  Findings: NG tube has been placed.  It is coiled in the fundus of the stomach.  No free intraperitoneal gas. Mildly distended small bowel loops with air-fluid levels are not significantly changed. There is a gas and stool within nondistended colon.  IMPRESSION: Stable partial small bowel obstruction pattern.  NG tube placed into the stomach.  No free intraperitoneal gas.   Original Report Authenticated By: Jolaine Click, M.D.    Dg Abd Acute W/chest  05/04/2012  *RADIOLOGY REPORT*  Clinical Data: Abdominal pain and distension.  ACUTE ABDOMEN SERIES (ABDOMEN 2 VIEW & CHEST 1 VIEW)  Comparison: 01/18/2011  Findings: Normal heart size and pulmonary vascularity.  No focal airspace consolidation in the lungs.  No blunting of costophrenic angles.  No pneumothorax.  There is gaseous distension of the upper and mid abdominal small bowel loops with multiple air-fluid levels on the upright view. Changes are consistent with small bowel obstruction.  No free intra- abdominal air.  Stool filled decompressed colon.  No radiopaque stones.  Surgical clip in the pelvis.  Visualized bones appear intact.  IMPRESSION: No evidence of active pulmonary disease.  Gaseous distension of small bowel with air-fluid levels suggesting small bowel obstruction.   Original Report Authenticated By: Burman Nieves, M.D.     Scheduled Meds:   . levothyroxine  50 mcg Oral QAC breakfast  . pantoprazole  40 mg Oral Daily   Continuous Infusions:   . sodium chloride 100 mL/hr at 05/05/12 0547    Active Problems:  Hyponatremia  SBO (small bowel obstruction)  Hypothyroidism  GERD (gastroesophageal reflux disease)     Brendia Sacks, MD  [facility] Team 6 Pager [phone] If 8PM-8AM, please contact night-coverage at [email or web address], password [facility] 05/05/2012, 9:28 AM  LOS: 1 day   Time spent: 25 minutes

## 2012-05-05 NOTE — Consult Note (Addendum)
General surgery attending note:  I have personally interviewed and examined this patient. I reviewed her plain films and CT scan. I agree with the assessment and treatment plan outlined by Zola Button, PA.  She has had a lot of GI symptoms and has undergone evaluation both here with Dr. Bosie Clos and at [facility]. This is her second hospitalization for presumed partial small bowel obstruction. There does look like a transition zone in the pelvis by CT.  Currently she is getting better with significant reduction in pain and distention. She wants the NG tube out. She wants to drink liquids.  Exam reveals her abdomen is fairly soft although she is a little bit tender in the suprapubic area.  Assessment: Partial SBO, presumably due to adhesions from her previous laparoscopic surgery either tubal ligation or endometriosis. Clinically improving. One prior hospitalization for SBO, August 2012. History of tubal ligation. History of endometriosis.  Plan: I would be in favor of bleeding the NG tube in for now, leaving it clamped and letting her try clear liquids. We will order lab and x-ray tomorrow. If she is continues to improve possibly remove NG tube tomorrow. If she re obstructs, she may need surgical intervention this weekend.   Angelia Mould. Derrell Lolling, M.D., [facility] General and Minimally invasive Surgery Breast and Colorectal Surgery Office:   [phone] Pager:   [phone]

## 2012-05-06 ENCOUNTER — Other Ambulatory Visit (HOSPITAL_COMMUNITY): Payer: Federal, State, Local not specified - PPO

## 2012-05-06 ENCOUNTER — Inpatient Hospital Stay (HOSPITAL_COMMUNITY): Payer: Federal, State, Local not specified - PPO

## 2012-05-06 LAB — BASIC METABOLIC PANEL
BUN: 4 mg/dL — ABNORMAL LOW (ref 6–23)
Chloride: 97 mEq/L (ref 96–112)
Creatinine, Ser: 0.47 mg/dL — ABNORMAL LOW (ref 0.50–1.10)
GFR calc Af Amer: >90 mL/min (ref >90)
GFR calc non Af Amer: >90 mL/min (ref >90)
Potassium: 3.9 mEq/L (ref 3.5–5.1)

## 2012-05-06 LAB — CBC
MCHC: 33 g/dL (ref 30.0–36.0)
Platelets: 221 10*3/uL (ref 150–400)
RDW: 12.8 % (ref 11.5–15.5)
WBC: 8.8 10*3/uL (ref 4.0–10.5)

## 2012-05-06 MED ORDER — HYDROMORPHONE HCL PF 1 MG/ML IJ SOLN: 1.0000 mg | INTRAMUSCULAR | Status: DC | PRN

## 2012-05-06 MED ORDER — ACETAMINOPHEN 325 MG PO TABS: 650.0000 mg | ORAL_TABLET | Freq: Four times a day (QID) | ORAL | Status: DC | PRN

## 2012-05-06 MED ORDER — FAMOTIDINE 20 MG PO TABS
20.0000 mg | ORAL_TABLET | Freq: Every day | ORAL | Status: DC
  Administered 2012-05-06: 20 mg via ORAL
  Filled 2012-05-06 (×2): qty 1

## 2012-05-06 MED ORDER — HYDROCODONE-ACETAMINOPHEN 5-325 MG PO TABS
1.0000 | ORAL_TABLET | ORAL | Status: DC | PRN
  Administered 2012-05-06 – 2012-05-07 (×2): 1 via ORAL
  Filled 2012-05-06 (×2): qty 1

## 2012-05-06 MED ORDER — LORATADINE 10 MG PO TABS
10.0000 mg | ORAL_TABLET | Freq: Every day | ORAL | Status: DC
  Administered 2012-05-06 – 2012-05-07 (×2): 10 mg via ORAL
  Filled 2012-05-06 (×2): qty 1

## 2012-05-06 NOTE — Progress Notes (Signed)
[facility] PROGRESS NOTE  Michelle RAULERSON WUJ:[id number] DOB: 25-Jun-1965 DOA: 05/04/2012 PCP: No primary provider on file. GASTROENTEROLOGIST: Shirley Friar, MD.  Assessment/Plan: 1. SBO--improving, defer to surgery. History of endometriosis surgery in 2009 as well as a tubal ligation, but no other abdominal surgeries. 2. 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 Workup negative by report. 3. Hypothyroidism--Synthroid.  Code Status: full code Family Communication: discussed with husband at bedside Disposition Plan: home when improved  Michelle Sacks, MD  [facility] Team 6 Pager [phone] If 8PM-8AM, please contact night-coverage at [email or web address], password [facility] 05/06/2012, 5:07 PM  LOS: 2 days   Brief narrative: 46 year old woman presented with abdominal pain, n/v. Imaging revealed SBO.  Consultants:  General surgery  Procedures:    HPI/Subjective: Feels much better, NGT out, no n/v. Tolerating clear liquids. No pain. Wants to go home.  Objective: Filed Vitals:   05/05/12 0630 05/05/12 1411 05/05/12 2110 05/06/12 0600  BP: 113/72 125/79 125/73 134/86  Pulse: 84 107 99 99  Temp: 98 F (36.7 C) 98.2 F (36.8 C) 99.5 F (37.5 C) 99 F (37.2 C)  TempSrc: Oral Oral Oral Oral  Resp: 18 20 18 18   Height:      Weight:      SpO2: 92% 91% 92% 94%    Intake/Output Summary (Last 24 hours) at 05/06/12 1707 Last data filed at 05/06/12 1400  Gross per 24 hour  Intake   3120 ml  Output   1450 ml  Net   1670 ml   Filed Weights   05/04/12 0556  Weight: 68.04 kg (150 lb)    Exam:  General:  Appears calm and more comfortable Cardiovascular: RRR, no m/r/g.  Respiratory: CTA bilaterally, no w/r/r. Normal respiratory effort. Abdomen: soft, non-tender Psychiatric: grossly normal mood and affect, speech fluent and appropriate  Data Reviewed: Basic Metabolic Panel:  Lab 05/06/12  0547 05/05/12 0427 05/04/12 1241 05/04/12 0621  NA 132* 138 -- 134*  K 3.9 4.7 -- 3.9  CL 97 104 -- 101  CO2 22 26 -- 21  GLUCOSE 78 96 -- 145*  BUN 4* 10 -- 11  CREATININE 0.47* 0.72 -- 0.58  CALCIUM 8.9 8.7 -- 9.9  MG -- 2.0 1.9 --  PHOS -- -- -- --   Liver Function Tests:  Lab 05/05/12 0427 05/04/12 0621  AST 15 20  ALT 13 18  ALKPHOS 105 136*  BILITOT 0.3 0.3  PROT 6.4 8.1  ALBUMIN 3.1* 4.1   CBC:  Lab 05/06/12 0547 05/04/12 1241 05/04/12 0621  WBC 8.8 9.7 8.7  NEUTROABS -- 8.8* 7.6  HGB 12.4 14.0 14.6  HCT 37.6 40.9 42.6  MCV 94.0 91.5 90.6  PLT 221 310 318   CBG:  Lab 05/04/12 1202  GLUCAP 114*    Studies: Dg Abd 2 Views  05/06/2012  *RADIOLOGY REPORT*  Clinical Data: Partial small bowel obstruction  ABDOMEN - 2 VIEW  Comparison: Recent KUB yesterday, 05/05/2012  Findings: Unchanged position of nasogastric tube coiled within the gastric fundus.  Slightly decreased gaseous distension of multiple loops of small bowel in the mid abdomen.  Gas is again noted throughout the colon to the level of the rectum.  No free air on the upright view.  There are several small air fluid levels on the right hemiabdomen.  Clip projects over the right anatomic pelvis. No acute osseous abnormality.  Patchy airspace opacities in the bilateral lung bases.  IMPRESSION:  1.  Similar to slightly improved partial small bowel obstruction. Gas is again noted throughout the colon to the level of the rectum. 2. Persistent patchy opacities in the bilateral lung bases which may represent atelectasis and / or consolidation.   Original Report Authenticated By: Malachy Moan, M.D.    Dg Abd 2 Views  05/05/2012  *RADIOLOGY REPORT*  Clinical Data: Small bowel obstruction  ABDOMEN - 2 VIEW  Comparison: Yesterday  Findings: NG tube has been placed.  It is coiled in the fundus of the stomach.  No free intraperitoneal gas. Mildly distended small bowel loops with air-fluid levels are not significantly  changed. There is a gas and stool within nondistended colon.  IMPRESSION: Stable partial small bowel obstruction pattern.  NG tube placed into the stomach.  No free intraperitoneal gas.   Original Report Authenticated By: Jolaine Click, M.D.     Scheduled Meds:    . levothyroxine  50 mcg Oral QAC breakfast  . pantoprazole  40 mg Oral Daily   Continuous Infusions:    . 0.9 % sodium chloride with kcl 125 mL/hr at 05/06/12 1427    Principal Problem:  *SBO (small bowel obstruction) Active Problems:  Hyponatremia  Hypothyroidism  GERD (gastroesophageal reflux disease)     Michelle Sacks, MD  [facility] Team 6 Pager [phone] If 8PM-8AM, please contact night-coverage at [email or web address], password [facility] 05/06/2012, 5:07 PM  LOS: 2 days   Time spent: 15 minutes

## 2012-05-06 NOTE — Progress Notes (Signed)
Patient ID: [name], female   DOB: [birth year], 46 y.o.   MRN: [id number] [facility] Surgery Progress Note:   * No surgery found *  Problem List: Patient Active Problem List  Diagnosis  . Hyponatremia  . SBO (small bowel obstruction)  . Hypothyroidism  . GERD (gastroesophageal reflux disease)   Subjective:  Feeling better and passing BM and flatus Objective: Vital signs in last 24 hours: Temp:  [98.2 F (36.8 C)-99.5 F (37.5 C)] 99 F (37.2 C) (12/14 0600) Pulse Rate:  [99-107] 99  (12/14 0600) Resp:  [18-20] 18  (12/14 0600) BP: (125-134)/(73-86) 134/86 mmHg (12/14 0600) SpO2:  [91 %-94 %] 94 % (12/14 0600) Physical Exam: Abdomen is nontender.  NG in place Lab Results:  Results for orders placed during the hospital encounter of 05/04/12 (from the past 48 hour(s))  GLUCOSE, CAPILLARY     Status: Abnormal   Collection Time   05/04/12 12:02 PM      Component Value Range Comment   Glucose-Capillary 114 (*) 70 - 99 mg/dL   CBC WITH DIFFERENTIAL     Status: Abnormal   Collection Time   05/04/12 12:41 PM      Component Value Range Comment   WBC 9.7  4.0 - 10.5 K/uL    RBC 4.47  3.87 - 5.11 MIL/uL    Hemoglobin 14.0  12.0 - 15.0 g/dL    HCT 40.9  81.1 - 91.4 %    MCV 91.5  78.0 - 100.0 fL    MCH 31.3  26.0 - 34.0 pg    MCHC 34.2  30.0 - 36.0 g/dL    RDW 78.2  95.6 - 21.3 %    Platelets 310  150 - 400 K/uL    Neutrophils Relative 91 (*) 43 - 77 %    Neutro Abs 8.8 (*) 1.7 - 7.7 K/uL    Lymphocytes Relative 4 (*) 12 - 46 %    Lymphs Abs 0.4 (*) 0.7 - 4.0 K/uL    Monocytes Relative 5  3 - 12 %    Monocytes Absolute 0.5  0.1 - 1.0 K/uL    Eosinophils Relative 0  0 - 5 %    Eosinophils Absolute 0.0  0.0 - 0.7 K/uL    Basophils Relative 0  0 - 1 %    Basophils Absolute 0.0  0.0 - 0.1 K/uL   LACTIC ACID, PLASMA     Status: Normal   Collection Time   05/04/12 12:41 PM      Component Value Range Comment   Lactic Acid, Venous 0.5  0.5 - 2.2 mmol/L   MAGNESIUM      Status: Normal   Collection Time   05/04/12 12:41 PM      Component Value Range Comment   Magnesium 1.9  1.5 - 2.5 mg/dL   MAGNESIUM     Status: Normal   Collection Time   05/05/12  4:27 AM      Component Value Range Comment   Magnesium 2.0  1.5 - 2.5 mg/dL   COMPREHENSIVE METABOLIC PANEL     Status: Abnormal   Collection Time   05/05/12  4:27 AM      Component Value Range Comment   Sodium 138  135 - 145 mEq/L    Potassium 4.7  3.5 - 5.1 mEq/L    Chloride 104  96 - 112 mEq/L    CO2 26  19 - 32 mEq/L    Glucose, Bld 96  70 -  99 mg/dL    BUN 10  6 - 23 mg/dL    Creatinine, Ser 1.61  0.50 - 1.10 mg/dL    Calcium 8.7  8.4 - 09.6 mg/dL    Total Protein 6.4  6.0 - 8.3 g/dL    Albumin 3.1 (*) 3.5 - 5.2 g/dL    AST 15  0 - 37 U/L    ALT 13  0 - 35 U/L    Alkaline Phosphatase 105  39 - 117 U/L    Total Bilirubin 0.3  0.3 - 1.2 mg/dL    GFR calc non Af Amer >90  >90 mL/min    GFR calc Af Amer >90  >90 mL/min   CBC     Status: Normal   Collection Time   05/06/12  5:47 AM      Component Value Range Comment   WBC 8.8  4.0 - 10.5 K/uL    RBC 4.00  3.87 - 5.11 MIL/uL    Hemoglobin 12.4  12.0 - 15.0 g/dL    HCT 04.5  40.9 - 81.1 %    MCV 94.0  78.0 - 100.0 fL    MCH 31.0  26.0 - 34.0 pg    MCHC 33.0  30.0 - 36.0 g/dL    RDW 91.4  78.2 - 95.6 %    Platelets 221  150 - 400 K/uL   BASIC METABOLIC PANEL     Status: Abnormal   Collection Time   05/06/12  5:47 AM      Component Value Range Comment   Sodium 132 (*) 135 - 145 mEq/L    Potassium 3.9  3.5 - 5.1 mEq/L DELTA CHECK NOTED   Chloride 97  96 - 112 mEq/L    CO2 22  19 - 32 mEq/L    Glucose, Bld 78  70 - 99 mg/dL    BUN 4 (*) 6 - 23 mg/dL    Creatinine, Ser 2.13 (*) 0.50 - 1.10 mg/dL DELTA CHECK NOTED   Calcium 8.9  8.4 - 10.5 mg/dL    GFR calc non Af Amer >90  >90 mL/min    GFR calc Af Amer >90  >90 mL/min    Radiology/Results: Dg Abd 2 Views  05/06/2012  *RADIOLOGY REPORT*  Clinical Data: Partial small bowel obstruction   ABDOMEN - 2 VIEW  Comparison: Recent KUB yesterday, 05/05/2012  Findings: Unchanged position of nasogastric tube coiled within the gastric fundus.  Slightly decreased gaseous distension of multiple loops of small bowel in the mid abdomen.  Gas is again noted throughout the colon to the level of the rectum.  No free air on the upright view.  There are several small air fluid levels on the right hemiabdomen.  Clip projects over the right anatomic pelvis. No acute osseous abnormality.  Patchy airspace opacities in the bilateral lung bases.  IMPRESSION:  1. Similar to slightly improved partial small bowel obstruction. Gas is again noted throughout the colon to the level of the rectum. 2. Persistent patchy opacities in the bilateral lung bases which may represent atelectasis and / or consolidation.   Original Report Authenticated By: Malachy Moan, M.D.    Dg Abd 2 Views  05/05/2012  *RADIOLOGY REPORT*  Clinical Data: Small bowel obstruction  ABDOMEN - 2 VIEW  Comparison: Yesterday  Findings: NG tube has been placed.  It is coiled in the fundus of the stomach.  No free intraperitoneal gas. Mildly distended small bowel loops with air-fluid levels are not significantly changed. There  is a gas and stool within nondistended colon.  IMPRESSION: Stable partial small bowel obstruction pattern.  NG tube placed into the stomach.  No free intraperitoneal gas.   Original Report Authenticated By: Jolaine Click, M.D.    Assessment/Plan: Problem List: Patient Active Problem List  Diagnosis  . Hyponatremia  . SBO (small bowel obstruction)  . Hypothyroidism  . GERD (gastroesophageal reflux disease)   Partial SBO resolving.  NG discontinued.  Start clear liquids * No surgery found *   LOS: 2 days   Matt B. Daphine Deutscher, MD, [facility] [phone] beeper [phone]  05/06/2012 10:17 AM

## 2012-05-07 ENCOUNTER — Inpatient Hospital Stay (HOSPITAL_COMMUNITY): Payer: Federal, State, Local not specified - PPO

## 2012-05-07 NOTE — Discharge Summary (Signed)
Physician Discharge Summary  BRIYA LOOKABAUGH ZOX:[id number] DOB: Sep 04, 1965 DOA: 05/04/2012  PCP: Mickie Hillier, MD GASTROENTEROLOGIST: Shirley Friar, MD.  Admit date: 05/04/2012 Discharge date: 05/07/2012   Follow-up Information    Follow up with Mickie Hillier, MD. (As needed)    Contact information:   [address] [place] [address] [phone]         Discharge Diagnoses:  1. SBO  2. 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 Imaging revealed SBO.  Hospital Course:  [name] was admitted for treatment of SBO and seen in consultation by general surgery. Her condition quickly improved with NG decompression and bowel rest. X-rays show resolution of obstruction and patient cleared by surgery for discharge.  1. SBO--clinically and radiographically resolved. Appreciate surgery recommendations. History of endometriosis surgery in 2009 as well as a tubal ligation. 2. 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 Workup negative by report.  3. Hypothyroidism--Synthroid.  Consultants:  General surgery  Procedures:  None   Discharge Instructions  Discharge Orders    Future Orders Please Complete By Expires   Discharge instructions      Comments:   Advance to full diet slowly, starting 12/16 if tolerating liquids. Call physician or seek immediate medical attention for nausea, vomiting, abdominal pain or worsening of condition.   Activity as tolerated - No restrictions          Medication List     As of 05/07/2012  9:53 AM    TAKE these medications         cetirizine 10 MG tablet   Commonly known as: ZYRTEC   Take 10 mg by mouth daily.      esomeprazole 40 MG capsule   Commonly known as: NEXIUM   Take 40 mg by mouth daily before breakfast.      levothyroxine 50 MCG tablet   Commonly known as: SYNTHROID, LEVOTHROID   Take 50 mcg by mouth daily.      ranitidine 150 MG tablet   Commonly known as: ZANTAC   Take 150 mg by mouth every evening.        The results of significant diagnostics from this hospitalization (including imaging, microbiology, ancillary and laboratory) are listed below for reference.    Significant Diagnostic Studies: Ct Abdomen Pelvis W Contrast  05/04/2012  *RADIOLOGY REPORT*  Clinical Data: Diffuse abdominal pain.  Nausea and vomiting.  CT ABDOMEN AND PELVIS WITH CONTRAST  Technique:  Multidetector CT imaging of the abdomen and pelvis was performed following the standard protocol during bolus administration of intravenous contrast.  Contrast: OMNIPAQUE IOHEXOL 300 MG/ML  SOLN  Comparison: 01/17/2011  Findings: Moderate dilatation of small bowel loops is seen with a transition point in the right adnexa adjacent to a surgical clip. This is consistent with a distal small bowel obstruction, likely due to adhesion.  No evidence of soft tissue masses or lymphadenopathy within the abdomen or pelvis.  No evidence of inflammatory process or abscess.  There is a tiny amount of free fluid in the pelvic cul-de-sac.  The abdominal parenchymal organs are normal in appearance.  Gallbladder is unremarkable.  IMPRESSION:  1.  Distal small bowel obstruction with transition point in the right adnexal region, likely due to  adhesion. 2.  No other significant abnormality identified.  No evidence of soft tissue mass or inflammatory process.   Original Report Authenticated By: Myles Rosenthal, M.D.    Dg Abd 2 Views  05/07/2012  *RADIOLOGY REPORT*  Clinical Data: Abdominal pain and distention.  Follow-up small bowel obstruction.  ABDOMEN - 2 VIEW  Comparison: 05/06/2012  Findings: Nasogastric tube  has been removed since prior study. Small bowel gas is noted, but there are no dilated small bowel loops.  There is scattered colonic gas seen to the level of rectum. No evidence of free air.  Clips from tubal ligation is again seen in the right pelvis.  IMPRESSION: Nonspecific bowel gas pattern following nasogastric tube removal. No dilated small bowel loops.   Original Report Authenticated By: Myles Rosenthal, M.D.    Labs: Basic Metabolic Panel:  Lab 05/06/12 1610 05/05/12 0427 05/04/12 1241 05/04/12 0621  NA 132* 138 -- 134*  K 3.9 4.7 -- 3.9  CL 97 104 -- 101  CO2 22 26 -- 21  GLUCOSE 78 96 -- 145*  BUN 4* 10 -- 11  CREATININE 0.47* 0.72 -- 0.58  CALCIUM 8.9 8.7 -- 9.9  MG -- 2.0 1.9 --  PHOS -- -- -- --   Liver Function Tests:  Lab 05/05/12 0427 05/04/12 0621  AST 15 20  ALT 13 18  ALKPHOS 105 136*  BILITOT 0.3 0.3  PROT 6.4 8.1  ALBUMIN 3.1* 4.1   CBC:  Lab 05/06/12 0547 05/04/12 1241 05/04/12 0621  WBC 8.8 9.7 8.7  NEUTROABS -- 8.8* 7.6  HGB 12.4 14.0 14.6  HCT 37.6 40.9 42.6  MCV 94.0 91.5 90.6  PLT 221 310 318   CBG:  Lab 05/04/12 1202  GLUCAP 114*    Principal Problem:  *SBO (small bowel obstruction) Active Problems:  Hyponatremia  Hypothyroidism  GERD (gastroesophageal reflux disease)   Time coordinating discharge: 15 minutes  Signed:  Brendia Sacks, MD [facility] 05/07/2012, 9:53 AM

## 2012-05-07 NOTE — Progress Notes (Signed)
Patient ID: [name], female   DOB: Sep 08, 1965, 46 y.o.   MRN: [id number] [facility] Surgery Progress Note:   * No surgery found *  Problem List: Patient Active Problem List  Diagnosis  . Hyponatremia  . SBO (small bowel obstruction)  . Hypothyroidism  . GERD (gastroesophageal reflux disease)   Subjective:  Tolerating clears and wanting to go home Objective: Vital signs in last 24 hours: Temp:  [98.6 F (37 C)-99.7 F (37.6 C)] 99.7 F (37.6 C) (12/15 0600) Pulse Rate:  [72-93] 72  (12/15 0600) Resp:  [18] 18  (12/15 0600) BP: (106-120)/(72-80) 106/72 mmHg (12/15 0600) SpO2:  [94 %] 94 % (12/15 0600) Physical Exam: nontender.  flatus Lab Results:  Results for orders placed during the hospital encounter of 05/04/12 (from the past 48 hour(s))  CBC     Status: Normal   Collection Time   05/06/12  5:47 AM      Component Value Range Comment   WBC 8.8  4.0 - 10.5 K/uL    RBC 4.00  3.87 - 5.11 MIL/uL    Hemoglobin 12.4  12.0 - 15.0 g/dL    HCT 08.6  57.8 - 46.9 %    MCV 94.0  78.0 - 100.0 fL    MCH 31.0  26.0 - 34.0 pg    MCHC 33.0  30.0 - 36.0 g/dL    RDW 62.9  52.8 - 41.3 %    Platelets 221  150 - 400 K/uL   BASIC METABOLIC PANEL     Status: Abnormal   Collection Time   05/06/12  5:47 AM      Component Value Range Comment   Sodium 132 (*) 135 - 145 mEq/L    Potassium 3.9  3.5 - 5.1 mEq/L DELTA CHECK NOTED   Chloride 97  96 - 112 mEq/L    CO2 22  19 - 32 mEq/L    Glucose, Bld 78  70 - 99 mg/dL    BUN 4 (*) 6 - 23 mg/dL    Creatinine, Ser 2.44 (*) 0.50 - 1.10 mg/dL DELTA CHECK NOTED   Calcium 8.9  8.4 - 10.5 mg/dL    GFR calc non Af Amer >90  >90 mL/min    GFR calc Af Amer >90  >90 mL/min    Radiology/Results: Dg Abd 2 Views  05/06/2012  *RADIOLOGY REPORT*  Clinical Data: Partial small bowel obstruction  ABDOMEN - 2 VIEW  Comparison: Recent KUB yesterday, 05/05/2012  Findings: Unchanged position of nasogastric tube coiled within the gastric fundus.  Slightly  decreased gaseous distension of multiple loops of small bowel in the mid abdomen.  Gas is again noted throughout the colon to the level of the rectum.  No free air on the upright view.  There are several small air fluid levels on the right hemiabdomen.  Clip projects over the right anatomic pelvis. No acute osseous abnormality.  Patchy airspace opacities in the bilateral lung bases.  IMPRESSION:  1. Similar to slightly improved partial small bowel obstruction. Gas is again noted throughout the colon to the level of the rectum. 2. Persistent patchy opacities in the bilateral lung bases which may represent atelectasis and / or consolidation.   Original Report Authenticated By: Malachy Moan, M.D.    Assessment/Plan: Problem List: Patient Active Problem List  Diagnosis  . Hyponatremia  . SBO (small bowel obstruction)  . Hypothyroidism  . GERD (gastroesophageal reflux disease)   Will advance to full liquids.  OK to discharge  later today if tolerating full liquid diet.  * No surgery found *   LOS: 3 days   Matt B. Daphine Deutscher, MD, [facility] [phone] beeper [phone]  05/07/2012 9:11 AM

## 2012-05-07 NOTE — Progress Notes (Signed)
[facility] PROGRESS NOTE  [name] ZOX:[id number] DOB: 20-Nov-1965 DOA: 05/04/2012 PCP: Mickie Hillier, MD GASTROENTEROLOGIST: Shirley Friar, MD.  Assessment/Plan: 1. SBO--clinically and radiographically resolved. Appreciate surgery recommendations. History of endometriosis surgery in 2009 as well as a tubal ligation, but no other abdominal surgeries. 2. 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 Workup negative by report. 3. Hypothyroidism--Synthroid.  Code Status: full code Family Communication: none present Disposition Plan: home later today if tolerates diet  Brendia Sacks, MD  [facility] Team 6 Pager [phone] If 8PM-8AM, please contact night-coverage at [email or web address], password [facility] 05/07/2012, 9:45 AM  LOS: 3 days   Brief narrative: 46 year old woman presented with abdominal pain, n/v. Imaging revealed SBO.  Consultants:  General surgery  Procedures:    HPI/Subjective: Feels good, no n/v or abdominal pain. Tolerating clears. Ready to go home.  Objective: Filed Vitals:   05/05/12 2110 05/06/12 0600 05/06/12 2115 05/07/12 0600  BP: 125/73 134/86 120/80 106/72  Pulse: 99 99 93 72  Temp: 99.5 F (37.5 C) 99 F (37.2 C) 98.6 F (37 C) 99.7 F (37.6 C)  TempSrc: Oral Oral Oral Oral  Resp: 18 18 18 18   Height:      Weight:      SpO2: 92% 94% 94% 94%    Intake/Output Summary (Last 24 hours) at 05/07/12 0945 Last data filed at 05/07/12 0651  Gross per 24 hour  Intake   1620 ml  Output   1700 ml  Net    -80 ml   Filed Weights   05/04/12 0556  Weight: 68.04 kg (150 lb)    Exam:  General:  Appears calm and omfortable Cardiovascular: RRR, no m/r/g.  Respiratory: CTA bilaterally, no w/r/r. Normal respiratory effort. Abdomen: soft, non-tender Psychiatric: grossly normal mood and affect, speech fluent and appropriate  Exam current 12/15  Data  Reviewed:  Studies: Dg Abd 2 Views  05/07/2012  *RADIOLOGY REPORT*  Clinical Data: Abdominal pain and distention.  Follow-up small bowel obstruction.  ABDOMEN - 2 VIEW  Comparison: 05/06/2012  Findings: Nasogastric tube has been removed since prior study. Small bowel gas is noted, but there are no dilated small bowel loops.  There is scattered colonic gas seen to the level of rectum. No evidence of free air.  Clips from tubal ligation is again seen in the right pelvis.  IMPRESSION: Nonspecific bowel gas pattern following nasogastric tube removal. No dilated small bowel loops.   Original Report Authenticated By: Myles Rosenthal, M.D.    Dg Abd 2 Views  05/06/2012  *RADIOLOGY REPORT*  Clinical Data: Partial small bowel obstruction  ABDOMEN - 2 VIEW  Comparison: Recent KUB yesterday, 05/05/2012  Findings: Unchanged position of nasogastric tube coiled within the gastric fundus.  Slightly decreased gaseous distension of multiple loops of small bowel in the mid abdomen.  Gas is again noted throughout the colon to the level of the rectum.  No free air on the upright view.  There are several small air fluid levels on the right hemiabdomen.  Clip projects over the right anatomic pelvis. No acute osseous abnormality.  Patchy airspace opacities in the bilateral lung bases.  IMPRESSION:  1. Similar to slightly improved partial small bowel obstruction. Gas is again noted throughout the colon to the level of the rectum. 2. Persistent patchy opacities in the bilateral lung bases which may represent atelectasis and / or consolidation.   Original Report Authenticated By: Malachy Moan, M.D.  Scheduled Meds:    . famotidine  20 mg Oral QHS  . levothyroxine  50 mcg Oral QAC breakfast  . loratadine  10 mg Oral Daily  . pantoprazole  40 mg Oral Daily   Continuous Infusions:    Principal Problem:  *SBO (small bowel obstruction) Active Problems:  Hyponatremia  Hypothyroidism  GERD (gastroesophageal reflux  disease)     Brendia Sacks, MD  [facility] Team 6 Pager [phone] If 8PM-8AM, please contact night-coverage at [email or web address], password [facility] 05/07/2012, 9:45 AM  LOS: 3 days

## 2012-06-09 ENCOUNTER — Other Ambulatory Visit: Payer: Self-pay | Admitting: Obstetrics and Gynecology

## 2012-07-24 ENCOUNTER — Other Ambulatory Visit: Payer: Self-pay | Admitting: Obstetrics and Gynecology

## 2013-03-02 IMAGING — CR DG ABDOMEN 2V
2 series · 2 of 2 positions shown · non-contrast
Comparison: 05/06/2012

CLINICAL DATA: Abdominal pain and distention.  Follow-up small
bowel obstruction.

ABDOMEN - 2 VIEW

[w abdomen upright *]
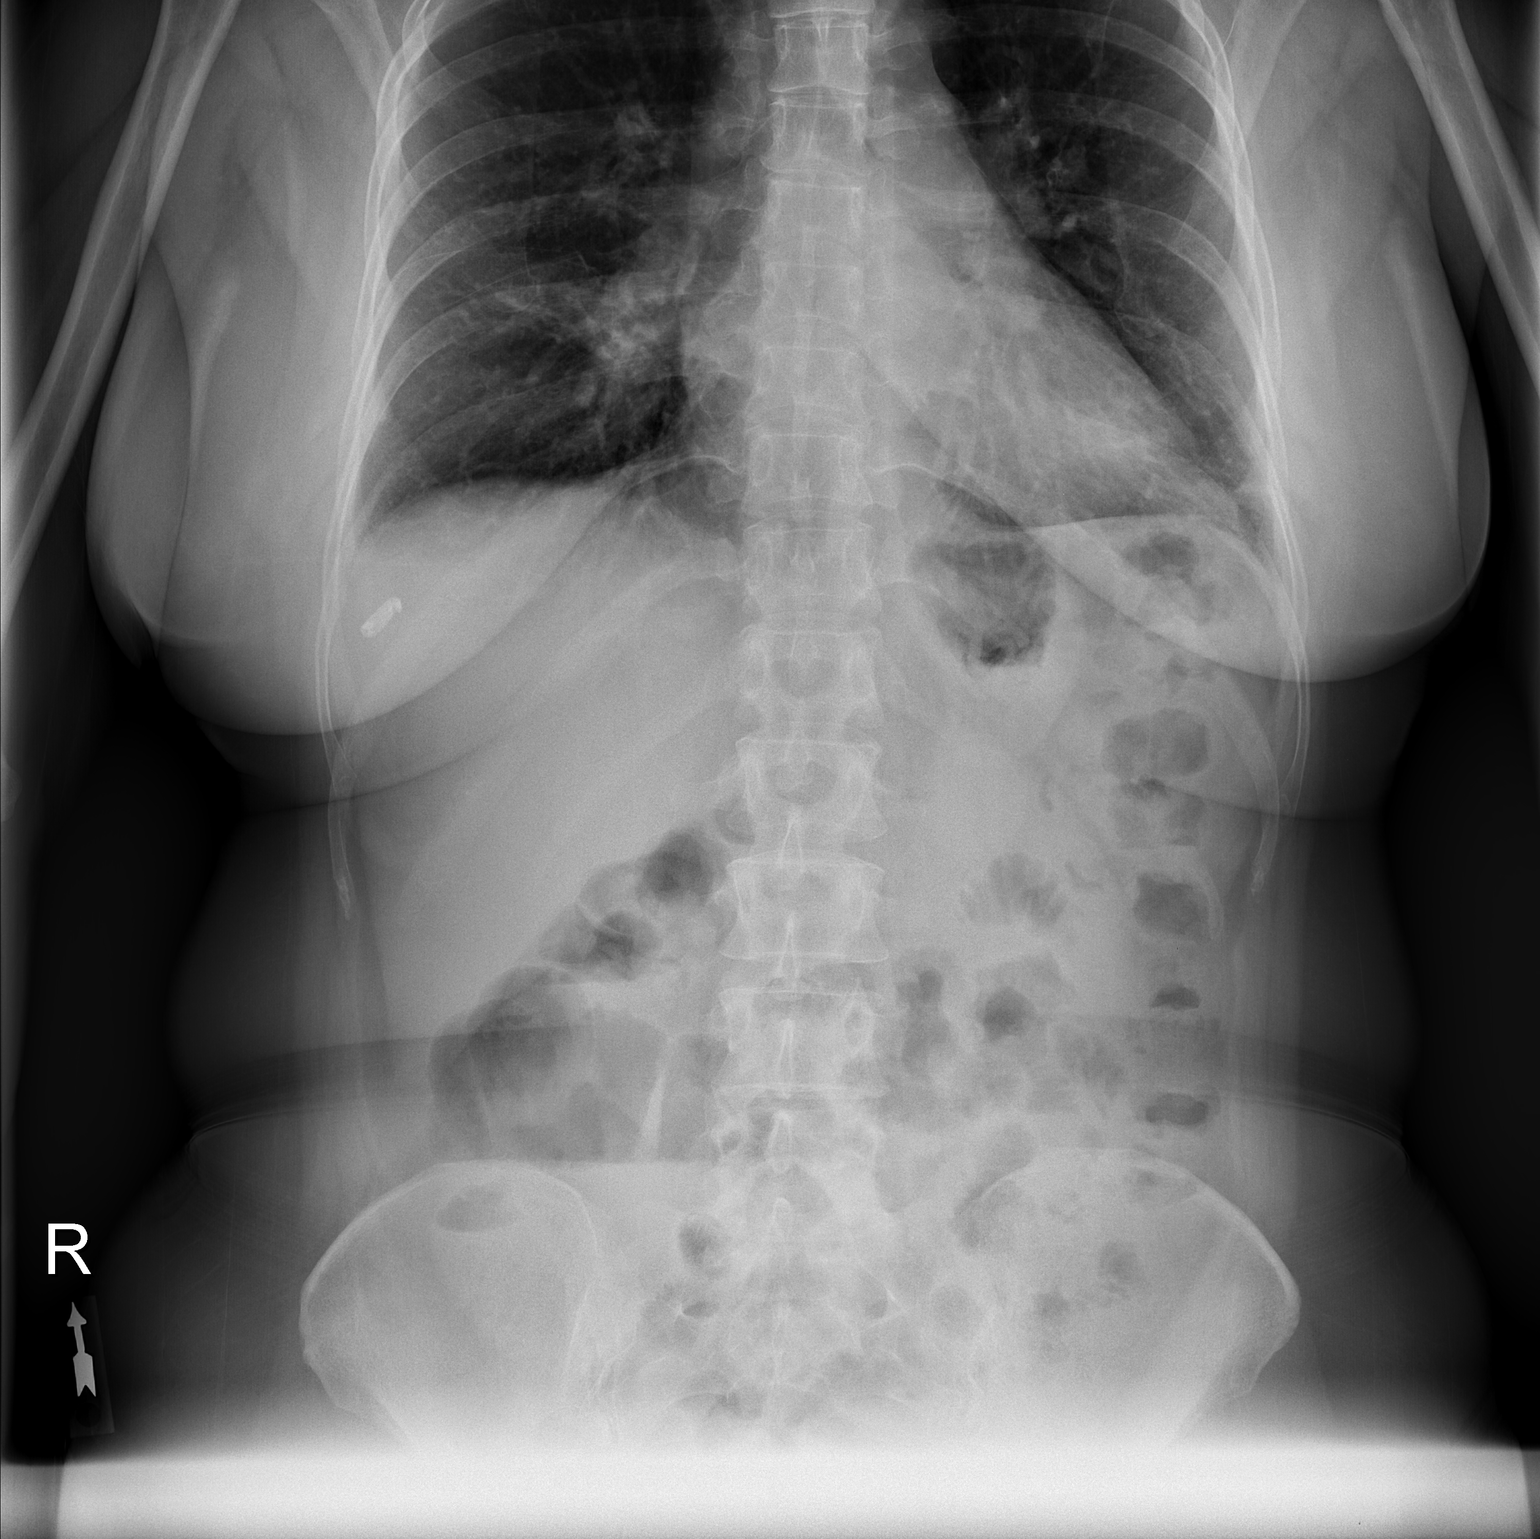

[t abdomen supine]
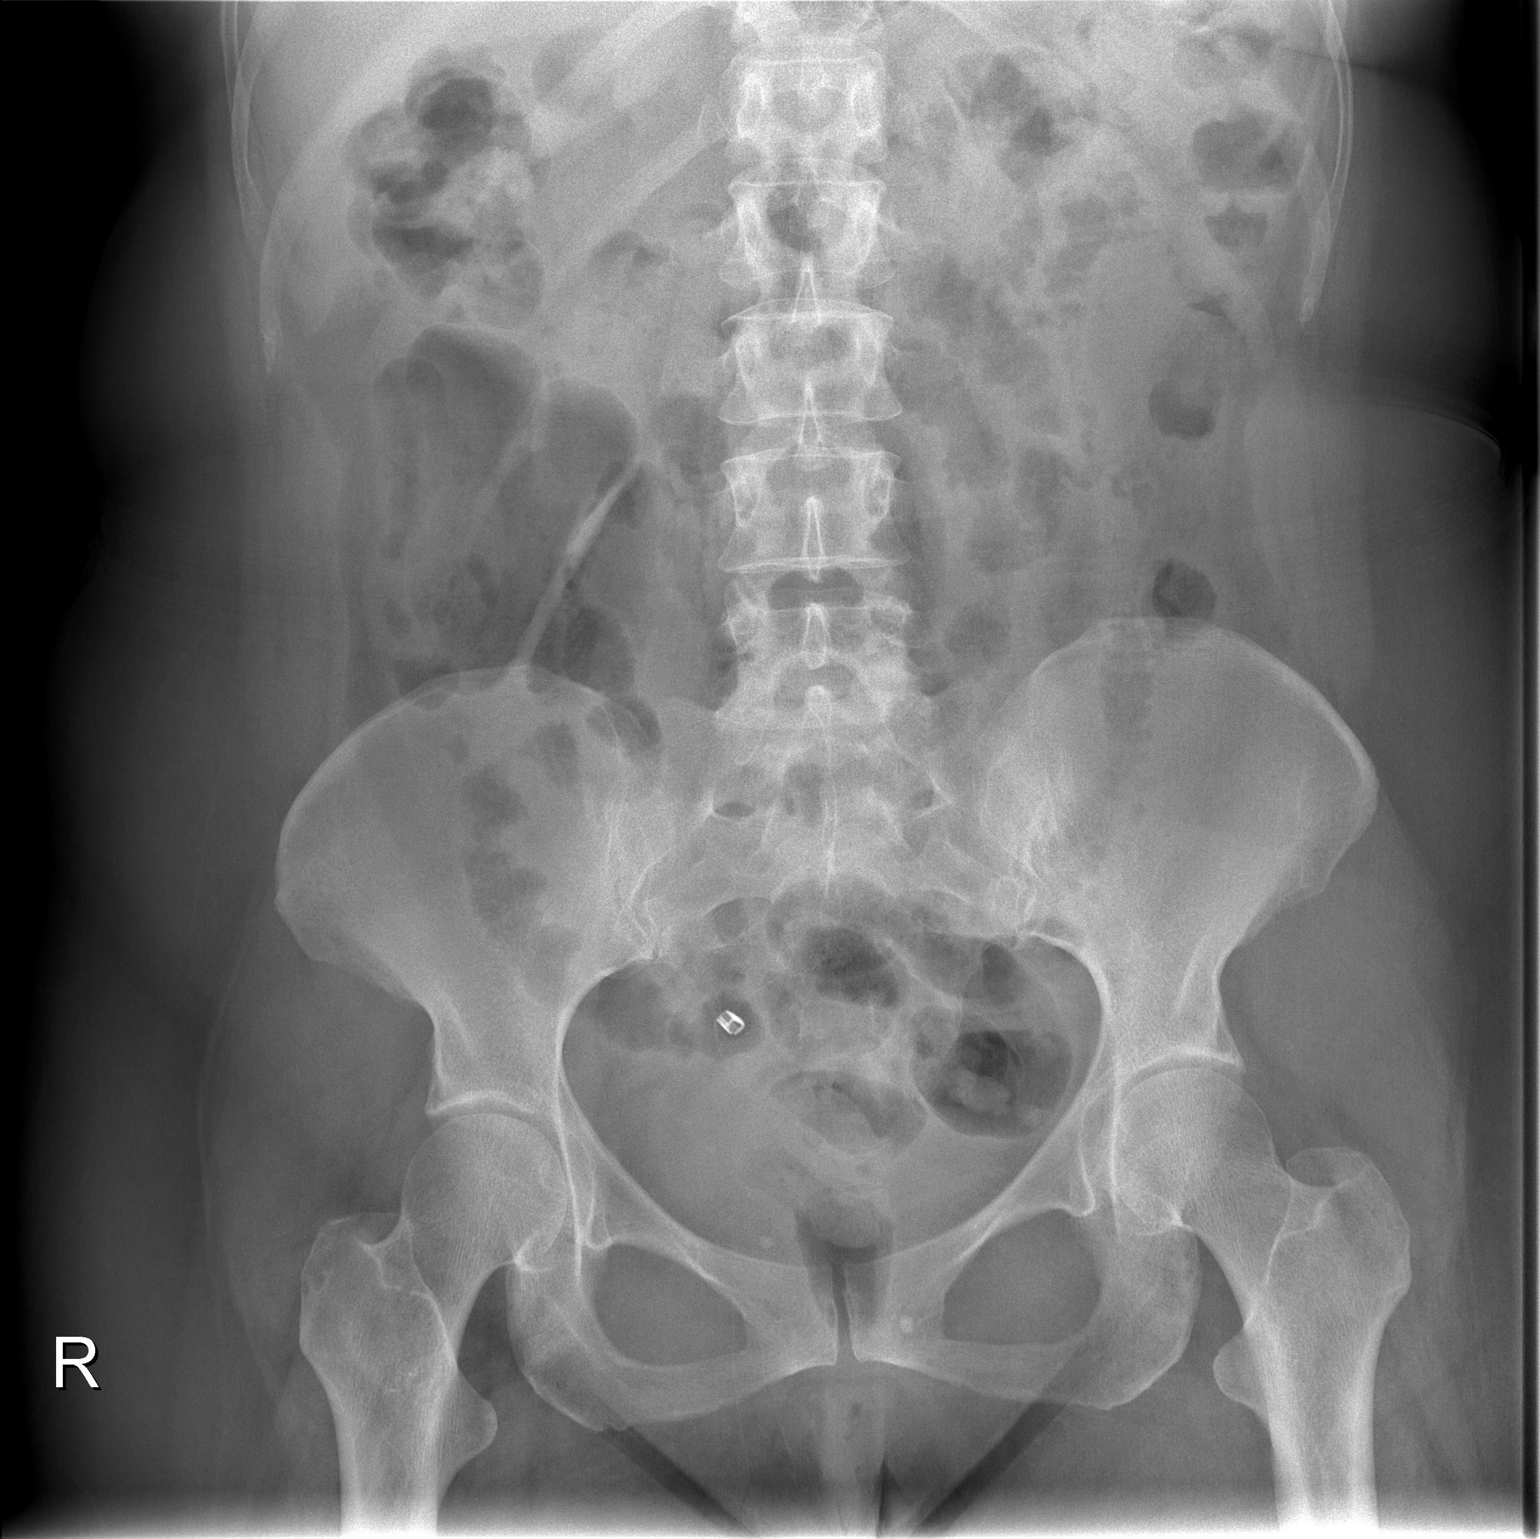

[2 of 2 positions shown; findings below may reference images not displayed]

FINDINGS: Nasogastric tube has been removed since prior study.
Small bowel gas is noted, but there are no dilated small bowel
loops.  There is scattered colonic gas seen to the level of rectum.
No evidence of free air.  Clips from tubal ligation is again seen
in the right pelvis.
IMPRESSION: Nonspecific bowel gas pattern following nasogastric tube removal.
No dilated small bowel loops.

## 2013-11-21 ENCOUNTER — Encounter (INDEPENDENT_AMBULATORY_CARE_PROVIDER_SITE_OTHER): Payer: Self-pay

## 2013-11-21 ENCOUNTER — Ambulatory Visit (INDEPENDENT_AMBULATORY_CARE_PROVIDER_SITE_OTHER): Payer: Federal, State, Local not specified - PPO | Admitting: Neurology

## 2013-11-21 ENCOUNTER — Encounter: Payer: Self-pay | Admitting: Neurology

## 2013-11-21 VITALS — BP 115/81 | HR 84 | Ht 59.0 in | Wt 152.0 lb

## 2013-11-21 DIAGNOSIS — R531 Weakness: Secondary | ICD-10-CM | POA: Insufficient documentation

## 2013-11-21 DIAGNOSIS — R5381 Other malaise: Secondary | ICD-10-CM

## 2013-11-21 DIAGNOSIS — G252 Other specified forms of tremor: Secondary | ICD-10-CM

## 2013-11-21 DIAGNOSIS — G250 Essential tremor: Secondary | ICD-10-CM

## 2013-11-21 DIAGNOSIS — R5383 Other fatigue: Secondary | ICD-10-CM

## 2013-11-21 HISTORY — DX: Weakness: R53.1

## 2013-11-21 NOTE — Progress Notes (Signed)
Reason for visit: Weakness  [name] is a 48 y.o. female  History of present illness:  [name] is a 48 year old left-handed white female with a history of some weakness involving mainly the right hand and arm that began about 3 months ago. The patient works in [employer], and she has to qualify at the pistol range with a handgun using the gun independently with both hands. She is left-handed, and she has noted that her grip strength with her right hand has declined, and she developed a tremor after several minutes of shooting with the right arm. The patient indicates that occasionally she will drop things from her hands, right greater than left. She reports no numbness or discomfort in the arms. She denies any neck pain or shoulder discomfort. She denies any significant family history of tremor, but she indicates that her mother at age 50 has a tremor that was felt secondary to her seizure medications. The patient denies any balance issues or problems controlling the bowels or the bladder. She denies any weakness of the legs. She is sent to this office for an evaluation.  Past Medical History  Diagnosis Date  . Small bowel obstruction   . Hypothyroidism   . Endometriosis   . Menopause   . Degenerative joint disease of low back   . Weakness 11/21/2013    Past Surgical History  Procedure Laterality Date  . Tubal ligation    . Back surgery    . Right wrist surgery      fracture  . Dilation and curettage of uterus      Family History  Problem Relation Age of Onset  . Epilepsy Mother   . Tremor Mother     Social history:  reports that she has quit smoking. She has never used smokeless tobacco. She reports that she drinks alcohol. She reports that she does not use illicit drugs.  Medications:  Current Outpatient Prescriptions on File Prior to Visit  Medication Sig Dispense Refill  . cetirizine (ZYRTEC) 10 MG tablet Take 10 mg by mouth daily.      Marland Kitchen. esomeprazole (NEXIUM)  40 MG capsule Take 40 mg by mouth daily before breakfast.      . ranitidine (ZANTAC) 150 MG tablet Take 150 mg by mouth every evening.       No current facility-administered medications on file prior to visit.     No Known Allergies  ROS:  Out of a complete 14 system review of symptoms, the patient complains only of the following symptoms, and all other reviewed systems are negative.  Weight gain Easy bruising, feeling hot Allergies, runny nose Weakness, tremor  Blood pressure 115/81, pulse 84, height 4\' 11"  (1.499 m), weight 152 lb (68.947 kg), last menstrual period 05/04/2012.  Physical Exam  General: The patient is alert and cooperative at the time of the examination.The patient is minimally to moderately obese.  Eyes: Pupils are equal, round, and reactive to light. Discs are flat bilaterally.  Neck: The neck is supple, no carotid bruits are noted.  Respiratory: The respiratory examination is clear.  Cardiovascular: The cardiovascular examination reveals a regular rate and rhythm, no obvious murmurs or rubs are noted.  Skin: Extremities are without significant edema.  Neurologic Exam  Mental status: The patient is alert and oriented x 3 at the time of the examination. The patient has apparent normal recent and remote memory, with an apparently normal attention span and concentration ability.  Cranial nerves: Facial symmetry  is present. There is good sensation of the face to pinprick and soft touch bilaterally. The strength of the facial muscles and the muscles to head turning and shoulder shrug are normal bilaterally. Speech is well enunciated, no aphasia or dysarthria is noted. Extraocular movements are full. Visual fields are full. The tongue is midline, and the patient has symmetric elevation of the soft palate. No obvious hearing deficits are noted.  Motor: The motor testing reveals 5 over 5 strength of all 4 extremities. Good symmetric motor tone is noted  throughout.  Sensory: Sensory testing is intact to pinprick, soft touch, vibration sensation, and position sense on all 4 extremities. No evidence of extinction is noted.  Coordination: Cerebellar testing reveals good finger-nose-finger and heel-to-shin bilaterally. No tremor is seen.  Gait and station: Gait is normal. Tandem gait is normal. Romberg is negative. No drift is seen.  Reflexes: Deep tendon reflexes are symmetric and normal bilaterally. Toes are downgoing bilaterally.   Assessment/Plan:  One. 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 The patient reports some alteration in strength of the right hand and arm over the last several months. Objective examination today is normal. No tremor is noted. The patient will be set up for blood work today, and she will have nerve conduction studies done on both arms, EMG of the right arm. If the studies are unremarkable, I would recommend muscle strengthening exercises to help improve her muscle stamina. The patient will followup for the EMG evaluation.   [name]. Keith Nneoma Harral MD 11/22/2013 4:45 PM  [facility] [address] [place], [place] [address]  Phone [phone] Fax [phone]

## 2013-11-22 LAB — CK: Total CK: 90 U/L (ref 24–173)

## 2013-11-22 LAB — ACETYLCHOLINE RECEPTOR, BINDING: AChR Binding Ab, Serum: 0.11 nmol/L (ref 0.00–0.24)

## 2013-11-22 LAB — SEDIMENTATION RATE: SED RATE: 13 mm/h (ref 0–40)

## 2013-11-27 ENCOUNTER — Ambulatory Visit (INDEPENDENT_AMBULATORY_CARE_PROVIDER_SITE_OTHER): Payer: Federal, State, Local not specified - PPO | Admitting: Neurology

## 2013-11-27 ENCOUNTER — Encounter (INDEPENDENT_AMBULATORY_CARE_PROVIDER_SITE_OTHER): Payer: Self-pay

## 2013-11-27 DIAGNOSIS — R5381 Other malaise: Secondary | ICD-10-CM

## 2013-11-27 DIAGNOSIS — M6281 Muscle weakness (generalized): Secondary | ICD-10-CM

## 2013-11-27 DIAGNOSIS — G250 Essential tremor: Secondary | ICD-10-CM

## 2013-11-27 DIAGNOSIS — R5383 Other fatigue: Secondary | ICD-10-CM

## 2013-11-27 DIAGNOSIS — Z0289 Encounter for other administrative examinations: Secondary | ICD-10-CM

## 2013-11-27 DIAGNOSIS — R531 Weakness: Secondary | ICD-10-CM

## 2013-11-27 DIAGNOSIS — G252 Other specified forms of tremor: Secondary | ICD-10-CM

## 2013-11-27 NOTE — Progress Notes (Signed)
Darrold JunkerKathy Edwards is a 48 year old patient with a history of some issues with a fatigue tremor involving the right arm. She reports no numbness or weakness of the right arm otherwise. The patient returns for EMG and nerve conduction study evaluation.  Nerve conduction studies done on both upper extremities were normal. EMG of the right arm was normal.  The patient is to continue muscle toning exercises on the arms, she will followup if needed but if the issue clearly is worsening, she will call me. The blood work done previously was unremarkable.

## 2013-11-27 NOTE — Procedures (Signed)
     HISTORY:  Darrold JunkerKathy Edwards is a 48 year old patient with a history of some issues with a fatigue tremor involving the right upper extremity. She is being evaluated for this issue which has been a problem over the last 3 months.  NERVE CONDUCTION STUDIES:  Nerve conduction studies were performed on both upper extremities. The distal motor latencies and motor amplitudes for the median and ulnar nerves were within normal limits. The F wave latencies and nerve conduction velocities for these nerves were also normal. The sensory latencies for the median and ulnar nerves were normal.   EMG STUDIES:  EMG study was performed on the right upper extremity:  The first dorsal interosseous muscle reveals 2 to 4 K units with full recruitment. No fibrillations or positive waves were noted. The abductor pollicis brevis muscle reveals 2 to 4 K units with full recruitment. No fibrillations or positive waves were noted. The extensor indicis proprius muscle reveals 1 to 3 K units with full recruitment. No fibrillations or positive waves were noted. The pronator teres muscle reveals 2 to 3 K units with full recruitment. No fibrillations or positive waves were noted. The biceps muscle reveals 1 to 2 K units with full recruitment. No fibrillations or positive waves were noted. The triceps muscle reveals 2 to 4 K units with full recruitment. No fibrillations or positive waves were noted. The anterior deltoid muscle reveals 2 to 3 K units with full recruitment. No fibrillations or positive waves were noted. The cervical paraspinal muscles were tested at 2 levels. No abnormalities of insertional activity were seen at either level tested. There was good relaxation.   IMPRESSION:  Nerve conduction studies done on both upper extremities were within normal limits. No evidence of a neuropathy is seen. EMG evaluation of the right upper extremity is normal, without evidence of an overlying cervical radiculopathy, or  evidence of a myopathic disorder.  [name]. [name] MD 11/27/2013 1:44 PM  [facility] [address] [place], [place] [address]  Phone [phone] Fax [phone]

## 2014-03-12 ENCOUNTER — Other Ambulatory Visit: Payer: Self-pay | Admitting: Obstetrics and Gynecology

## 2014-03-13 LAB — CYTOLOGY - PAP

## 2014-09-10 ENCOUNTER — Other Ambulatory Visit: Payer: Self-pay | Admitting: Obstetrics and Gynecology

## 2014-09-11 LAB — CYTOLOGY - PAP
